# Patient Record
Sex: Female | Born: 1985 | Race: Black or African American | Hispanic: No | Marital: Single | Smoking: Never smoker
Health system: Southern US, Community
[De-identification: ages and names within clinical notes are randomized; demographics above are authoritative.]

## PROBLEM LIST (undated history)

## (undated) DIAGNOSIS — J302 Other seasonal allergic rhinitis: Secondary | ICD-10-CM

## (undated) DIAGNOSIS — Z8619 Personal history of other infectious and parasitic diseases: Secondary | ICD-10-CM

## (undated) DIAGNOSIS — G43909 Migraine, unspecified, not intractable, without status migrainosus: Secondary | ICD-10-CM

## (undated) DIAGNOSIS — K589 Irritable bowel syndrome without diarrhea: Secondary | ICD-10-CM

## (undated) DIAGNOSIS — B379 Candidiasis, unspecified: Secondary | ICD-10-CM

## (undated) DIAGNOSIS — Z8742 Personal history of other diseases of the female genital tract: Secondary | ICD-10-CM

## (undated) DIAGNOSIS — R490 Dysphonia: Secondary | ICD-10-CM

## (undated) DIAGNOSIS — Z8719 Personal history of other diseases of the digestive system: Secondary | ICD-10-CM

## (undated) DIAGNOSIS — T7840XA Allergy, unspecified, initial encounter: Secondary | ICD-10-CM

## (undated) DIAGNOSIS — J45909 Unspecified asthma, uncomplicated: Secondary | ICD-10-CM

## (undated) DIAGNOSIS — E282 Polycystic ovarian syndrome: Secondary | ICD-10-CM

## (undated) DIAGNOSIS — K629 Disease of anus and rectum, unspecified: Secondary | ICD-10-CM

## (undated) DIAGNOSIS — Z87410 Personal history of cervical dysplasia: Secondary | ICD-10-CM

## (undated) DIAGNOSIS — Z973 Presence of spectacles and contact lenses: Secondary | ICD-10-CM

## (undated) DIAGNOSIS — N926 Irregular menstruation, unspecified: Secondary | ICD-10-CM

## (undated) HISTORY — DX: Polycystic ovarian syndrome: E28.2

## (undated) HISTORY — DX: Personal history of other diseases of the female genital tract: Z87.42

## (undated) HISTORY — DX: Personal history of other infectious and parasitic diseases: Z86.19

## (undated) HISTORY — DX: Candidiasis, unspecified: B37.9

## (undated) HISTORY — PX: COLONOSCOPY: SHX174

## (undated) HISTORY — PX: TONGUE SURGERY: SHX810

## (undated) HISTORY — PX: LEEP: SHX91

## (undated) HISTORY — DX: Allergy, unspecified, initial encounter: T78.40XA

## (undated) HISTORY — PX: ROOT CANAL: SHX2363

## (undated) HISTORY — DX: Irritable bowel syndrome, unspecified: K58.9

## (undated) HISTORY — DX: Irregular menstruation, unspecified: N92.6

## (undated) HISTORY — PX: COLONOSCOPY W/ BIOPSIES: SHX1374

## (undated) HISTORY — DX: Migraine, unspecified, not intractable, without status migrainosus: G43.909

---

## 2004-12-17 ENCOUNTER — Emergency Department (HOSPITAL_COMMUNITY): Admission: EM | Admit: 2004-12-17 | Discharge: 2004-12-17 | Payer: Self-pay | Admitting: Emergency Medicine

## 2004-12-17 ENCOUNTER — Ambulatory Visit (HOSPITAL_COMMUNITY): Admission: RE | Admit: 2004-12-17 | Discharge: 2004-12-17 | Payer: Self-pay | Admitting: Emergency Medicine

## 2007-11-10 DIAGNOSIS — E282 Polycystic ovarian syndrome: Secondary | ICD-10-CM

## 2007-11-10 DIAGNOSIS — Z8742 Personal history of other diseases of the female genital tract: Secondary | ICD-10-CM

## 2007-11-10 DIAGNOSIS — N926 Irregular menstruation, unspecified: Secondary | ICD-10-CM

## 2007-11-10 HISTORY — DX: Irregular menstruation, unspecified: N92.6

## 2007-11-10 HISTORY — DX: Personal history of other diseases of the female genital tract: Z87.42

## 2007-11-10 HISTORY — DX: Polycystic ovarian syndrome: E28.2

## 2008-11-22 ENCOUNTER — Emergency Department (HOSPITAL_COMMUNITY): Admission: EM | Admit: 2008-11-22 | Discharge: 2008-11-22 | Payer: Self-pay | Admitting: Emergency Medicine

## 2010-09-11 ENCOUNTER — Emergency Department (HOSPITAL_COMMUNITY)
Admission: EM | Admit: 2010-09-11 | Discharge: 2010-09-11 | Payer: Self-pay | Source: Home / Self Care | Admitting: Family Medicine

## 2010-11-09 HISTORY — PX: BREAST SURGERY: SHX581

## 2011-04-08 ENCOUNTER — Encounter (HOSPITAL_BASED_OUTPATIENT_CLINIC_OR_DEPARTMENT_OTHER)
Admission: RE | Admit: 2011-04-08 | Discharge: 2011-04-08 | Disposition: A | Discharge status: Home / Self Care | Payer: BC Managed Care – PPO | Source: Ambulatory Visit | Attending: Specialist | Admitting: Specialist

## 2011-04-08 LAB — BASIC METABOLIC PANEL
CO2: 28 mEq/L (ref 19–32)
Chloride: 101 mEq/L (ref 96–112)
Creatinine, Ser: 0.66 mg/dL (ref 0.4–1.2)
GFR calc Af Amer: >60 mL/min (ref >60)
Sodium: 137 mEq/L (ref 135–145)

## 2011-04-08 LAB — CBC
Hemoglobin: 12.9 g/dL (ref 12.0–15.0)
MCH: 27.1 pg (ref 26.0–34.0)
Platelets: 312 10*3/uL (ref 150–400)
RBC: 4.76 MIL/uL (ref 3.87–5.11)
WBC: 7 10*3/uL (ref 4.0–10.5)

## 2011-04-08 LAB — DIFFERENTIAL
Basophils Absolute: 0 10*3/uL (ref 0.0–0.1)
Basophils Relative: 0 % (ref 0–1)
Eosinophils Absolute: 0.1 10*3/uL (ref 0.0–0.7)
Monocytes Relative: 7 % (ref 3–12)
Neutro Abs: 4.3 10*3/uL (ref 1.7–7.7)
Neutrophils Relative %: 61 % (ref 43–77)

## 2011-04-13 ENCOUNTER — Other Ambulatory Visit: Payer: Self-pay | Admitting: Specialist

## 2011-04-13 ENCOUNTER — Ambulatory Visit (HOSPITAL_BASED_OUTPATIENT_CLINIC_OR_DEPARTMENT_OTHER)
Admission: RE | Admit: 2011-04-13 | Discharge: 2011-04-14 | Disposition: A | Discharge status: Home / Self Care | Payer: BC Managed Care – PPO | Source: Ambulatory Visit | Attending: Specialist | Admitting: Specialist

## 2011-04-13 DIAGNOSIS — N62 Hypertrophy of breast: Secondary | ICD-10-CM | POA: Insufficient documentation

## 2011-04-13 DIAGNOSIS — Z01812 Encounter for preprocedural laboratory examination: Secondary | ICD-10-CM | POA: Insufficient documentation

## 2011-04-13 HISTORY — PX: BREAST REDUCTION SURGERY: SHX8

## 2011-04-13 LAB — POCT HEMOGLOBIN-HEMACUE: Hemoglobin: 12.3 g/dL (ref 12.0–15.0)

## 2011-09-14 NOTE — Op Note (Signed)
  Marie Singh, Marie Singh              ACCOUNT NO.:  [id number]  MEDICAL RECORD NO.:  [id number]  LOCATION:                                 FACILITY:  PHYSICIAN:  Earvin Hansen L. Shon Hough, M.D.   DATE OF BIRTH:  DATE OF PROCEDURE:  04/13/2011 DATE OF DISCHARGE:                              OPERATIVE REPORT   A 24-year lady with severe-severe macromastia, back and shoulder pain secondary to large pendulous breasts, intertriginous changes, resistant to conservative aggressive treatment.  PROCEDURES PLANNED:  Planned bilateral breast reductions using the inferior pedicle technique.  ANESTHESIA:  General.  DESCRIPTION OF THE PROCEDURE:  Preoperatively, the patient set up and drawn for the reduction mammoplasty, remarked nipple-areolar complexes back up to 21 cm from the suprasternal notch.  She then underwent general anesthesia and intubated orally.  Prep was done to the chest and breast areas in routine fashion.  Using Hibiclens soap and solution, walled off with sterile towels and drapes so as to make a sterile field. Xylocaine 0.25% with epinephrine injected locally, 200 mL per side 1:400,000 concentration.  This was allowed to sit.  The wounds were scored with #15 blade and the skin of the inferior pedicle was de- epithelialized with #20 blade.  Medial and lateral fatty dermal pedicles were incised down to underlying fascia.  Laterally more accessory breast tissue was removed.  The new keyhole area was also debulked and the flap trimmed appropriately.  After proper hemostasis, irrigation, and coagulation of bleeders, the flaps were transposed and stayed with 3-0 Prolene suture.  Subcutaneous closure was done with 3-0 Monocryl x2 layers and then running subcuticular stitch of 3-0 Monocryl and 5-0 Monocryl throughout the inverted T.  The wounds were cleansed.  Steri- Strips and soft dressing were applied to all areas.  They were also drained with #10 fully fluted Blake drains, which  were placed in the depths of the wound and brought out laterally and secured with 3-0 Prolene.  After sterile dressing were placed, the patient was taken to the recovery room in excellent condition.  Nipple-areolar complexes were examined showing excellent symmetry and blood supply.     Yaakov Guthrie. Shon Hough, M.D.     Cathie Hoops  D:  04/13/2011  T:  04/14/2011  Job:  [id number]  Electronically Signed by Louisa Second M.D. on 09/14/2011 07:08:08 PM

## 2012-06-13 ENCOUNTER — Telehealth: Payer: Self-pay | Admitting: Obstetrics and Gynecology

## 2012-06-13 NOTE — Telephone Encounter (Signed)
TC to pt. States had been having recurrent vaginal itching and irritation on and off x 1 month. Used Monistat but has recurred.  Sched for eval with Dr AVS 06/21/12 per pt preference.

## 2012-06-13 NOTE — Telephone Encounter (Signed)
TRIAGE/GEN.QUEST. °

## 2012-06-21 ENCOUNTER — Encounter: Payer: Self-pay | Admitting: Obstetrics and Gynecology

## 2012-06-21 ENCOUNTER — Ambulatory Visit (INDEPENDENT_AMBULATORY_CARE_PROVIDER_SITE_OTHER): Payer: BC Managed Care – PPO | Admitting: Obstetrics and Gynecology

## 2012-06-21 VITALS — BP 110/78 | Resp 16 | Ht 65.0 in | Wt 188.0 lb

## 2012-06-21 DIAGNOSIS — N899 Noninflammatory disorder of vagina, unspecified: Secondary | ICD-10-CM

## 2012-06-21 DIAGNOSIS — N898 Other specified noninflammatory disorders of vagina: Secondary | ICD-10-CM

## 2012-06-21 LAB — POCT WET PREP (WET MOUNT)

## 2012-06-21 MED ORDER — NYSTATIN-TRIAMCINOLONE 100000-0.1 UNIT/GM-% EX CREA: TOPICAL_CREAM | Freq: Four times a day (QID) | CUTANEOUS | Status: DC

## 2012-06-21 MED ORDER — FLUCONAZOLE 150 MG PO TABS: 150.0000 mg | ORAL_TABLET | Freq: Every day | ORAL | Status: AC

## 2012-06-21 NOTE — Patient Instructions (Signed)
Candida Infection, Adult A candida infection (also called yeast, fungus and Monilia infection) is an overgrowth of yeast that can occur anywhere on the body. A yeast infection commonly occurs in warm, moist body areas. Usually, the infection remains localized but can spread to become a systemic infection. A yeast infection may be a sign of a more severe disease such as diabetes, leukemia, or AIDS. A yeast infection can occur in both men and women. In women, Candida vaginitis is a vaginal infection. It is one of the most common causes of vaginitis. Men usually do not have symptoms or know they have an infection until other problems develop. Men may find out they have a yeast infection because their sex partner has a yeast infection. Uncircumcised men are more likely to get a yeast infection than circumcised men. This is because the uncircumcised glans is not exposed to air and does not remain as dry as that of a circumcised glans. Older adults may develop yeast infections around dentures. CAUSES  Women  Antibiotics.   Steroid medication taken for a long time.   Being overweight (obese).   Diabetes.   Poor immune condition.   Certain serious medical conditions.   Immune suppressive medications for organ transplant patients.   Chemotherapy.   Pregnancy.   Menstration.   Stress and fatigue.   Intravenous drug use.   Oral contraceptives.   Wearing tight-fitting clothes in the crotch area.   Catching it from a sex partner who has a yeast infection.   Spermicide.   Intravenous, urinary, or other catheters.  Men  Catching it from a sex partner who has a yeast infection.   Having oral or anal sex with a person who has the infection.   Spermicide.   Diabetes.   Antibiotics.   Poor immune system.   Medications that suppress the immune system.   Intravenous drug use.   Intravenous, urinary, or other catheters.  SYMPTOMS  Women  Thick, white vaginal discharge.    Vaginal itching.   Redness and swelling in and around the vagina.   Irritation of the lips of the vagina and perineum.   Blisters on the vaginal lips and perineum.   Painful sexual intercourse.   Low blood sugar (hypoglycemia).   Painful urination.   Bladder infections.   Intestinal problems such as constipation, indigestion, bad breath, bloating, increase in gas, diarrhea, or loose stools.  Men  Men may develop intestinal problems such as constipation, indigestion, bad breath, bloating, increase in gas, diarrhea, or loose stools.   Dry, cracked skin on the penis with itching or discomfort.   Jock itch.   Dry, flaky skin.   Athlete's foot.   Hypoglycemia.  DIAGNOSIS  Women  A history and an exam are performed.   The discharge may be examined under a microscope.   A culture may be taken of the discharge.  Men  A history and an exam are performed.   Any discharge from the penis or areas of cracked skin will be looked at under the microscope and cultured.   Stool samples may be cultured.  TREATMENT  Women  Vaginal antifungal suppositories and creams.   Medicated creams to decrease irritation and itching on the outside of the vagina.   Warm compresses to the perineal area to decrease swelling and discomfort.   Oral antifungal medications.   Medicated vaginal suppositories or cream for repeated or recurrent infections.   Wash and dry the irritation areas before applying the cream.     Eating yogurt with lactobacillus may help with prevention and treatment.   Sometimes painting the vagina with gentian violet solution may help if creams and suppositories do not work.  Men  Antifungal creams and oral antifungal medications.   Sometimes treatment must continue for 30 days after the symptoms go away to prevent recurrence.  HOME CARE INSTRUCTIONS  Women  Use cotton underwear and avoid tight-fitting clothing.   Avoid colored, scented toilet paper and  deodorant tampons or pads.   Do not douche.   Keep your diabetes under control.   Finish all the prescribed medications.   Keep your skin clean and dry.   Consume milk or yogurt with lactobacillus active culture regularly. If you get frequent yeast infections and think that is what the infection is, there are over-the-counter medications that you can get. If the infection does not show healing in 3 days, talk to your caregiver.   Tell your sex partner you have a yeast infection. Your partner may need treatment also, especially if your infection does not clear up or recurs.  Men  Keep your skin clean and dry.   Keep your diabetes under control.   Finish all prescribed medications.   Tell your sex partner that you have a yeast infection so they can be treated if necessary.  SEEK MEDICAL CARE IF:   Your symptoms do not clear up or worsen in one week after treatment.   You have an oral temperature above 102 F (38.9 C).   You have trouble swallowing or eating for a prolonged time.   You develop blisters on and around your vagina.   You develop vaginal bleeding and it is not your menstrual period.   You develop abdominal pain.   You develop intestinal problems as mentioned above.   You get weak or lightheaded.   You have painful or increased urination.   You have pain during sexual intercourse.  MAKE SURE YOU:   Understand these instructions.   Will watch your condition.   Will get help right away if you are not doing well or get worse.  Document Released: 12/03/2004 Document Revised: 10/15/2011 Document Reviewed: 03/17/2010 [employer] Patient Information 2012 [employer]. 

## 2012-06-21 NOTE — Progress Notes (Signed)
Odor: no Fever: no Pelvic Pain: no  Itching: yes Dyspareunia: no Desires GC/CT: no  Thin: no History of PID: no Desires HIV,RPR,HbsAG: no  Thick: yes History of STD: no Other: pt states she didi use "One a day" to try to help before w/ yeast infection.    HISTORY OF PRESENT ILLNESS  [name] is a 26 y.o. year old female,No obstetric history on file., who presents for a problem visit. The patient complains of vulvar and vaginal irritation.  She has used over-the-counter yeast medication.  Discomfort and continues to return.  The patient is not sexually active.  She does take oral contraceptives for dysmenorrhea.  Subjective:  See the written copy of this report in the patient's paper medical record.  These results did not interface directly into the electronic medical record and are summarized here. History of present illness.  Objective:  BP 110/78  Resp 16  Ht 5\' 5"  (1.651 m)  Wt 188 lb (85.276 kg)  BMI 31.28 kg/m2  LMP 06/13/2012   General: no distress GI: soft and nontender  External genitalia: dry scaly skin Vaginal: normal without tenderness, induration or masses Cervix: normal appearance Adnexa: normal bimanual exam Uterus: normal size shape and consistency  Wet prep: PH 4.5.  No clue cells.  No yeast seen. Whiff negative . Assessment:  Vaginal and vulvar irritation.  Plan:  We'll try Diflucan 150 mg and Mycolog-II cream as needed.  Call if no improvement.  Annual exam in November 2013.  Return to office prn if symptoms worsen or fail to improve.   Leonard Schwartz M.D.  06/21/2012 3:20 PM

## 2012-10-05 ENCOUNTER — Encounter: Payer: Self-pay | Admitting: Obstetrics and Gynecology

## 2012-10-05 ENCOUNTER — Ambulatory Visit (INDEPENDENT_AMBULATORY_CARE_PROVIDER_SITE_OTHER): Payer: BC Managed Care – PPO | Admitting: Obstetrics and Gynecology

## 2012-10-05 VITALS — BP 94/62 | Resp 16 | Ht 64.5 in | Wt 183.0 lb

## 2012-10-05 DIAGNOSIS — Z01419 Encounter for gynecological examination (general) (routine) without abnormal findings: Secondary | ICD-10-CM

## 2012-10-05 MED ORDER — NORETHINDRONE-ETH ESTRADIOL 1-35 MG-MCG PO TABS: 1.0000 | ORAL_TABLET | Freq: Every day | ORAL | Status: DC

## 2012-10-05 NOTE — Progress Notes (Signed)
ANNUAL GYNECOLOGIC EXAMINATION   Marie Singh is a 26 y.o. female, No obstetric history on file., who presents for an annual exam. The patient has a history of polycystic ovary syndrome and dysmenorrhea.  She was started on Necon 1/35 birth control pills.  She is doing very well at this point.  She is not sexually active.    History   Social History  . Marital Status: Single    Spouse Name: N/A    Number of Children: N/A  . Years of Education: N/A   Social History Main Topics  . Smoking status: Never Smoker   . Smokeless tobacco: Never Used  . Alcohol Use: Yes  . Drug Use: None  . Sexually Active: Not Currently    Birth Control/ Protection: Pill   Other Topics Concern  . None   Social History Narrative  . None    Menstrual cycle:   LMP: Patient's last menstrual period was 09/23/2012.             The following portions of the patient's history were reviewed and updated as appropriate: allergies, current medications, past family history, past medical history, past social history, past surgical history and problem list.  Review of Systems Pertinent items are noted in HPI. Breast:Negative for breast lump,nipple discharge or nipple retraction Gastrointestinal: Negative for abdominal pain, change in bowel habits or rectal bleeding Urinary:negative   Objective:    Ht 5' 4.5" (1.638 m)  Wt 183 lb (83.008 kg)  BMI 30.93 kg/m2  LMP 09/23/2012    Weight:  Wt Readings from Last 1 Encounters:  10/05/12 183 lb (83.008 kg)          BMI: Body mass index is 30.93 kg/(m^2).  General Appearance: Alert, appropriate appearance for age. No acute distress HEENT: Grossly normal Neck / Thyroid: Supple, no masses, nodes or enlargement Lungs: clear to auscultation bilaterally Back: No CVA tenderness Breast Exam: No masses or nodes.No dimpling, nipple retraction or discharge. Cardiovascular: Regular rate and rhythm. S1, S2, no murmur Gastrointestinal: Soft, non-tender, no masses or  organomegaly  ++++++++++++++++++++++++++++++++++++++++++++++++++++++++  Pelvic Exam: External genitalia: normal general appearance Vaginal: normal without tenderness, induration or masses. Relaxation: No Cervix: normal appearance Adnexa: normal bimanual exam Uterus: normal size, shape, and consistency Rectovaginal: normal rectal, no masses  ++++++++++++++++++++++++++++++++++++++++++++++++++++++++  Lymphatic Exam: Non-palpable nodes in neck, clavicular, axillary, or inguinal regions Neurologic: Normal speech, no tremor  Psychiatric: Alert and oriented, appropriate affect.  Assessment:    Normal gyn exam   Overweight or obese: Yes   Pelvic relaxation: No  Polycystic ovary syndrome-improved.  Dysmenorrhea-improved.   Plan:    return annually or prn Contraception:Necon 1/35    Medications prescribed: none  STD screen request: No   The updated Pap smear screening guidelines were discussed with the patient. The patient requested that I obtain a Pap smear: No.  Kegel exercises discussed: No.  Proper diet and regular exercise were reviewed.  Annual mammograms recommended starting at age 62. Proper breast care was discussed.  Screening colonoscopy is recommended beginning at age 26.  Regular health maintenance was reviewed.  Sleep hygiene was discussed.  Leonard Schwartz M.D.    Regular Periods: yes Mammogram: no  Monthly Breast Ex.: yes Exercise: yes  Tetanus < 10 years: yes Seatbelts: yes  NI. Bladder Functn.: yes Abuse at home: no  Daily BM's: yes Stressful Work: yes  Healthy Diet: yes Sigmoid-Colonoscopy: Never  Calcium: no Medical problems this year: None per pt    LAST PAP:09/21/2011  Contraception: Necon 1/35  Mammogram:  Never  PCP: Dr.Lucas  PMH: No Changes  FMH: No Changes  Last Bone Scan: Never

## 2013-02-03 ENCOUNTER — Encounter: Payer: Self-pay | Admitting: Obstetrics and Gynecology

## 2013-02-03 ENCOUNTER — Other Ambulatory Visit: Payer: Self-pay | Admitting: Obstetrics and Gynecology

## 2013-02-06 LAB — PAP IG W/ RFLX HPV ASCU

## 2013-02-27 ENCOUNTER — Other Ambulatory Visit: Payer: Self-pay | Admitting: Obstetrics and Gynecology

## 2013-03-25 ENCOUNTER — Encounter (HOSPITAL_COMMUNITY): Payer: Self-pay | Admitting: *Deleted

## 2013-03-25 DIAGNOSIS — Z880 Allergy status to penicillin: Secondary | ICD-10-CM | POA: Insufficient documentation

## 2013-03-25 DIAGNOSIS — Z8742 Personal history of other diseases of the female genital tract: Secondary | ICD-10-CM | POA: Insufficient documentation

## 2013-03-25 DIAGNOSIS — Z3202 Encounter for pregnancy test, result negative: Secondary | ICD-10-CM | POA: Insufficient documentation

## 2013-03-25 DIAGNOSIS — IMO0002 Reserved for concepts with insufficient information to code with codable children: Secondary | ICD-10-CM | POA: Insufficient documentation

## 2013-03-25 DIAGNOSIS — S0993XA Unspecified injury of face, initial encounter: Secondary | ICD-10-CM | POA: Diagnosis present

## 2013-03-25 DIAGNOSIS — Z8639 Personal history of other endocrine, nutritional and metabolic disease: Secondary | ICD-10-CM | POA: Insufficient documentation

## 2013-03-25 DIAGNOSIS — Z862 Personal history of diseases of the blood and blood-forming organs and certain disorders involving the immune mechanism: Secondary | ICD-10-CM | POA: Diagnosis not present

## 2013-03-25 DIAGNOSIS — Y92410 Unspecified street and highway as the place of occurrence of the external cause: Secondary | ICD-10-CM | POA: Insufficient documentation

## 2013-03-25 DIAGNOSIS — Y9389 Activity, other specified: Secondary | ICD-10-CM | POA: Insufficient documentation

## 2013-03-25 DIAGNOSIS — Z8619 Personal history of other infectious and parasitic diseases: Secondary | ICD-10-CM | POA: Insufficient documentation

## 2013-03-25 DIAGNOSIS — Z8679 Personal history of other diseases of the circulatory system: Secondary | ICD-10-CM | POA: Insufficient documentation

## 2013-03-25 DIAGNOSIS — Z79899 Other long term (current) drug therapy: Secondary | ICD-10-CM | POA: Insufficient documentation

## 2013-03-25 DIAGNOSIS — Z8719 Personal history of other diseases of the digestive system: Secondary | ICD-10-CM | POA: Insufficient documentation
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
## 2013-03-26 ENCOUNTER — Emergency Department (HOSPITAL_COMMUNITY)
Admission: EM | Admit: 2013-03-26 | Discharge: 2013-03-26 | Disposition: A | Discharge status: Home / Self Care | Payer: BC Managed Care – PPO | Attending: Emergency Medicine | Admitting: Emergency Medicine

## 2013-03-26 ENCOUNTER — Emergency Department (HOSPITAL_COMMUNITY): Payer: BC Managed Care – PPO

## 2013-03-26 DIAGNOSIS — S0993XA Unspecified injury of face, initial encounter: Secondary | ICD-10-CM | POA: Diagnosis not present

## 2013-03-26 DIAGNOSIS — S199XXA Unspecified injury of neck, initial encounter: Secondary | ICD-10-CM | POA: Diagnosis not present

## 2013-03-26 MED ORDER — TRAMADOL HCL 50 MG PO TABS: 50.0000 mg | ORAL_TABLET | Freq: Four times a day (QID) | ORAL | Status: DC | PRN

## 2013-03-26 MED ORDER — IBUPROFEN 800 MG PO TABS: 800.0000 mg | ORAL_TABLET | Freq: Three times a day (TID) | ORAL | Status: DC

## 2013-03-26 MED ORDER — CYCLOBENZAPRINE HCL 10 MG PO TABS: 10.0000 mg | ORAL_TABLET | Freq: Two times a day (BID) | ORAL | Status: DC | PRN

## 2013-03-26 MED ORDER — IBUPROFEN 800 MG PO TABS
800.0000 mg | ORAL_TABLET | Freq: Once | ORAL | Status: AC
  Administered 2013-03-26: 800 mg via ORAL
  Filled 2013-03-26: qty 1

## 2013-03-26 MED ORDER — DIAZEPAM 5 MG PO TABS
5.0000 mg | ORAL_TABLET | Freq: Once | ORAL | Status: AC
  Administered 2013-03-26: 5 mg via ORAL
  Filled 2013-03-26: qty 1

## 2013-03-26 NOTE — ED Notes (Signed)
Pt returned from radiology.

## 2013-03-26 NOTE — ED Notes (Signed)
MD at bedside for assessment

## 2013-03-26 NOTE — ED Notes (Signed)
Pt denies numbness or tingling to extremities.

## 2013-03-26 NOTE — ED Notes (Signed)
Pt's family states understanding of discharge instructions
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
## 2013-07-24 ENCOUNTER — Encounter (HOSPITAL_COMMUNITY): Payer: Self-pay | Admitting: Emergency Medicine

## 2013-07-24 ENCOUNTER — Emergency Department (HOSPITAL_COMMUNITY)
Admission: EM | Admit: 2013-07-24 | Discharge: 2013-07-24 | Disposition: A | Discharge status: Home / Self Care | Payer: BC Managed Care – PPO | Source: Home / Self Care | Attending: Family Medicine | Admitting: Family Medicine

## 2013-07-24 DIAGNOSIS — R51 Headache: Secondary | ICD-10-CM

## 2013-07-24 MED ORDER — KETOROLAC TROMETHAMINE 30 MG/ML IJ SOLN
INTRAMUSCULAR | Status: AC
  Filled 2013-07-24: qty 1

## 2013-07-24 MED ORDER — FLUTICASONE PROPIONATE 50 MCG/ACT NA SUSP: 2.0000 | Freq: Two times a day (BID) | NASAL | Status: DC

## 2013-07-24 MED ORDER — HYDROCOD POLST-CHLORPHEN POLST 10-8 MG/5ML PO LQCR: 5.0000 mL | Freq: Two times a day (BID) | ORAL | Status: DC | PRN

## 2013-07-24 MED ORDER — KETOROLAC TROMETHAMINE 30 MG/ML IJ SOLN
30.0000 mg | Freq: Once | INTRAMUSCULAR | Status: AC
  Administered 2013-07-24: 30 mg via INTRAMUSCULAR

## 2013-07-24 NOTE — ED Notes (Signed)
C/o headache which started last night.  Patient states she has pressure behind her eye due to headache. PCP not aware.

## 2013-07-24 NOTE — ED Provider Notes (Signed)
CSN: [id number]     Arrival date & time 07/24/13  1722 History   First MD Initiated Contact with Patient 07/24/13 1809     Chief Complaint  Patient presents with  . Headache   (Consider location/radiation/quality/duration/timing/severity/associated sxs/prior Treatment) Patient is a 27 y.o. female presenting with headaches. The history is provided by the patient and a parent.  Headache Pain location:  R temporal Quality:  Stabbing Radiates to:  Does not radiate Onset quality:  Gradual Duration:  1 day Timing:  Constant Progression:  Unchanged Chronicity:  New Similar to prior headaches: yes   Context: coughing   Associated symptoms: congestion, cough and eye pain   Associated symptoms: no blurred vision, no facial pain, no fever, no nausea and no visual change     Past Medical History  Diagnosis Date  . Migraine     Frequent  . IBS (irritable bowel syndrome)   . H/O varicella   . Yeast infection   . H/O dysmenorrhea 2009  . Irregular periods/menstrual cycles 2009  . PCOS (polycystic ovarian syndrome) 2009   Past Surgical History  Procedure Laterality Date  . Tongue surgery    . Root canal    . Breast surgery  2012    Breast  Reduction   Family History  Problem Relation Age of Onset  . Diabetes Mother   . Heart attack Father   . Hypertension Father    History  Substance Use Topics  . Smoking status: Never Smoker   . Smokeless tobacco: Never Used  . Alcohol Use: Yes   OB History   Grav Para Term Preterm Abortions TAB SAB Ect Mult Living                 Review of Systems  Constitutional: Negative.  Negative for fever.  HENT: Positive for congestion.   Eyes: Positive for pain. Negative for blurred vision.  Respiratory: Positive for cough.   Gastrointestinal: Negative for nausea.  Neurological: Positive for headaches.    Allergies  Amoxicillin  Home Medications   Current Outpatient Rx  Name  Route  Sig  Dispense  Refill  . ibuprofen (ADVIL,MOTRIN)  800 MG tablet   Oral   Take 1 tablet (800 mg total) by mouth 3 (three) times daily.   21 tablet   0   . norethindrone-ethinyl estradiol 1/35 (NECON 1/35, 28,) tablet   Oral   Take 1 tablet by mouth daily.   1 Package   11   . chlorpheniramine-HYDROcodone (TUSSIONEX PENNKINETIC ER) 10-8 MG/5ML LQCR   Oral   Take 5 mLs by mouth every 12 (twelve) hours as needed.   115 mL   0   . cyclobenzaprine (FLEXERIL) 10 MG tablet   Oral   Take 1 tablet (10 mg total) by mouth 2 (two) times daily as needed for muscle spasms.   20 tablet   0   . fluticasone (FLONASE) 50 MCG/ACT nasal spray   Nasal   Place 2 sprays into the nose 2 (two) times daily.   1 g   2   . traMADol (ULTRAM) 50 MG tablet   Oral   Take 1 tablet (50 mg total) by mouth every 6 (six) hours as needed for pain.   15 tablet   0    BP 114/72  Pulse 70  Temp(Src) 97.7 F (36.5 C) (Oral)  Resp 18  SpO2 98%  LMP 07/16/2013 Physical Exam  Nursing note and vitals reviewed. Constitutional: She is oriented to person,  place, and time. She appears well-developed and well-nourished. No distress.  HENT:  Head: Normocephalic.  Right Ear: External ear normal.  Left Ear: External ear normal.  Nose: Mucosal edema and rhinorrhea present.  Mouth/Throat: Oropharynx is clear and moist.  Eyes: Conjunctivae and EOM are normal. Pupils are equal, round, and reactive to light.  Neck: Normal range of motion. Neck supple.  Cardiovascular: Normal rate, normal heart sounds and intact distal pulses.   Pulmonary/Chest: Effort normal and breath sounds normal.  Abdominal: Bowel sounds are normal.  Lymphadenopathy:    She has no cervical adenopathy.  Neurological: She is alert and oriented to person, place, and time. No cranial nerve deficit. She exhibits normal muscle tone. Coordination normal.  Skin: Skin is warm and dry.    ED Course  Procedures (including critical care time) Labs Review Labs Reviewed - No data to display Imaging  Review No results found.  MDM      Linna Hoff, MD 07/24/13 [phone]

## 2013-09-08 ENCOUNTER — Emergency Department (HOSPITAL_COMMUNITY)
Admission: EM | Admit: 2013-09-08 | Discharge: 2013-09-08 | Disposition: A | Discharge status: Home / Self Care | Payer: BC Managed Care – PPO | Source: Home / Self Care

## 2013-09-08 ENCOUNTER — Encounter (HOSPITAL_COMMUNITY): Payer: Self-pay | Admitting: Emergency Medicine

## 2013-09-08 DIAGNOSIS — N390 Urinary tract infection, site not specified: Secondary | ICD-10-CM

## 2013-09-08 LAB — POCT URINALYSIS DIP (DEVICE)
Glucose, UA: NEGATIVE mg/dL
Ketones, ur: NEGATIVE mg/dL
Protein, ur: NEGATIVE mg/dL
Specific Gravity, Urine: 1.015 (ref 1.005–1.030)
Urobilinogen, UA: 0.2 mg/dL (ref 0.0–1.0)
pH: 8.5 — ABNORMAL HIGH (ref 5.0–8.0)

## 2013-09-08 LAB — POCT PREGNANCY, URINE: Preg Test, Ur: NEGATIVE

## 2013-09-08 MED ORDER — CIPROFLOXACIN HCL 500 MG PO TABS: 500.0000 mg | ORAL_TABLET | Freq: Two times a day (BID) | ORAL | Status: DC

## 2013-09-08 NOTE — ED Notes (Signed)
States she did a home test that showed she probably has a UTI, having symptoms

## 2013-09-08 NOTE — ED Provider Notes (Signed)
CSN: [id number]     Arrival date & time 09/08/13  1651 History   None    Chief Complaint  Patient presents with  . Urinary Tract Infection   (Consider location/radiation/quality/duration/timing/severity/associated sxs/prior Treatment) Patient is a 27 y.o. female presenting with urinary tract infection. The history is provided by the patient.  Urinary Tract Infection This is a new problem. The current episode started more than 1 week ago. The problem has been gradually worsening. Associated symptoms include abdominal pain.    Past Medical History  Diagnosis Date  . Migraine     Frequent  . IBS (irritable bowel syndrome)   . H/O varicella   . Yeast infection   . H/O dysmenorrhea 2009  . Irregular periods/menstrual cycles 2009  . PCOS (polycystic ovarian syndrome) 2009   Past Surgical History  Procedure Laterality Date  . Tongue surgery    . Root canal    . Breast surgery  2012    Breast  Reduction   Family History  Problem Relation Age of Onset  . Diabetes Mother   . Heart attack Father   . Hypertension Father    History  Substance Use Topics  . Smoking status: Never Smoker   . Smokeless tobacco: Never Used  . Alcohol Use: Yes   OB History   Grav Para Term Preterm Abortions TAB SAB Ect Mult Living                 Review of Systems  Constitutional: Negative for fever and chills.  Gastrointestinal: Positive for abdominal pain. Negative for nausea and vomiting.  Genitourinary: Positive for dysuria, urgency and frequency. Negative for vaginal discharge.    Allergies  Amoxicillin  Home Medications   Current Outpatient Rx  Name  Route  Sig  Dispense  Refill  . norethindrone-ethinyl estradiol 1/35 ([place] 1/35, 28,) tablet   Oral   Take 1 tablet by mouth daily.   1 Package   11   . chlorpheniramine-HYDROcodone (TUSSIONEX PENNKINETIC ER) 10-8 MG/5ML LQCR   Oral   Take 5 mLs by mouth every 12 (twelve) hours as needed.   115 mL   0   . ciprofloxacin  (CIPRO) 500 MG tablet   Oral   Take 1 tablet (500 mg total) by mouth 2 (two) times daily.   14 tablet   0   . cyclobenzaprine (FLEXERIL) 10 MG tablet   Oral   Take 1 tablet (10 mg total) by mouth 2 (two) times daily as needed for muscle spasms.   20 tablet   0   . fluticasone (FLONASE) 50 MCG/ACT nasal spray   Nasal   Place 2 sprays into the nose 2 (two) times daily.   1 g   2   . ibuprofen (ADVIL,MOTRIN) 800 MG tablet   Oral   Take 1 tablet (800 mg total) by mouth 3 (three) times daily.   21 tablet   0   . traMADol (ULTRAM) 50 MG tablet   Oral   Take 1 tablet (50 mg total) by mouth every 6 (six) hours as needed for pain.   15 tablet   0    BP 113/73  Pulse 60  Temp(Src) 98.8 F (37.1 C) (Oral)  Resp 14  SpO2 100% Physical Exam  Nursing note and vitals reviewed. Constitutional: She is oriented to person, place, and time. She appears well-developed and well-nourished.  Abdominal: Soft. Bowel sounds are normal. She exhibits no distension and no mass. There is tenderness in the  suprapubic area. There is no rigidity, no rebound, no guarding and no CVA tenderness.  Neurological: She is alert and oriented to person, place, and time.  Skin: Skin is warm and dry.    ED Course  Procedures (including critical care time) Labs Review Labs Reviewed  POCT URINALYSIS DIP (DEVICE) - Abnormal; Notable for the following:    Hgb urine dipstick LARGE (*)    pH 8.5 (*)    Leukocytes, UA MODERATE (*)    All other components within normal limits  POCT PREGNANCY, URINE   Imaging Review No results found.    MDM      Linna Hoff, MD 09/08/13 1800

## 2013-11-01 ENCOUNTER — Other Ambulatory Visit: Payer: Self-pay | Admitting: Obstetrics and Gynecology

## 2014-09-19 ENCOUNTER — Ambulatory Visit (HOSPITAL_COMMUNITY): Payer: BC Managed Care – PPO | Attending: Emergency Medicine

## 2014-09-19 ENCOUNTER — Emergency Department (HOSPITAL_COMMUNITY)
Admission: EM | Admit: 2014-09-19 | Discharge: 2014-09-19 | Disposition: A | Discharge status: Home / Self Care | Payer: BC Managed Care – PPO | Source: Home / Self Care | Attending: Emergency Medicine | Admitting: Emergency Medicine

## 2014-09-19 ENCOUNTER — Encounter (HOSPITAL_COMMUNITY): Payer: Self-pay | Admitting: Emergency Medicine

## 2014-09-19 DIAGNOSIS — R0602 Shortness of breath: Secondary | ICD-10-CM | POA: Diagnosis present

## 2014-09-19 DIAGNOSIS — Z793 Long term (current) use of hormonal contraceptives: Secondary | ICD-10-CM | POA: Diagnosis not present

## 2014-09-19 DIAGNOSIS — J40 Bronchitis, not specified as acute or chronic: Secondary | ICD-10-CM | POA: Insufficient documentation

## 2014-09-19 DIAGNOSIS — K589 Irritable bowel syndrome without diarrhea: Secondary | ICD-10-CM | POA: Insufficient documentation

## 2014-09-19 DIAGNOSIS — J069 Acute upper respiratory infection, unspecified: Secondary | ICD-10-CM | POA: Diagnosis present

## 2014-09-19 DIAGNOSIS — G43909 Migraine, unspecified, not intractable, without status migrainosus: Secondary | ICD-10-CM | POA: Insufficient documentation

## 2014-09-19 DIAGNOSIS — E282 Polycystic ovarian syndrome: Secondary | ICD-10-CM | POA: Diagnosis not present

## 2014-09-19 MED ORDER — AEROCHAMBER PLUS FLO-VU LARGE MISC: 1.0000 | Freq: Once | Status: DC

## 2014-09-19 MED ORDER — GUAIFENESIN-CODEINE 100-10 MG/5ML PO SOLN: 5.0000 mL | ORAL | Status: DC | PRN

## 2014-09-19 MED ORDER — ALBUTEROL SULFATE HFA 108 (90 BASE) MCG/ACT IN AERS: 2.0000 | INHALATION_SPRAY | RESPIRATORY_TRACT | Status: DC | PRN

## 2014-09-19 NOTE — ED Notes (Signed)
C/o cold sx onset this am Sx include productive cough, PND Reports she was sick about a month ago w/cold sx Denies f/v/n/d, SOB, wheezing Alert, no signs of acute distress.

## 2014-09-19 NOTE — Discharge Instructions (Signed)
How to Use an Inhaler Proper inhaler technique is very important. Good technique ensures that the medicine reaches the lungs. Poor technique results in depositing the medicine on the tongue and back of the throat rather than in the airways. If you do not use the inhaler with good technique, the medicine will not help you. STEPS TO FOLLOW IF USING AN INHALER WITHOUT AN EXTENSION TUBE  Remove the cap from the inhaler.  If you are using the inhaler for the first time, you will need to prime it. Shake the inhaler for 5 seconds and release four puffs into the air, away from your face. Ask your health care provider or pharmacist if you have questions about priming your inhaler.  Shake the inhaler for 5 seconds before each breath in (inhalation).  Position the inhaler so that the top of the canister faces up.  Put your index finger on the top of the medicine canister. Your thumb supports the bottom of the inhaler.  Open your mouth.  Either place the inhaler between your teeth and place your lips tightly around the mouthpiece, or hold the inhaler 1-2 inches away from your open mouth. If you are unsure of which technique to use, ask your health care provider.  Breathe out (exhale) normally and as completely as possible.  Press the canister down with your index finger to release the medicine.  At the same time as the canister is pressed, inhale deeply and slowly until your lungs are completely filled. This should take 4-6 seconds. Keep your tongue down.  Hold the medicine in your lungs for 5-10 seconds (10 seconds is best). This helps the medicine get into the small airways of your lungs.  Breathe out slowly, through pursed lips. Whistling is an example of pursed lips.  Wait at least 15-30 seconds between puffs. Continue with the above steps until you have taken the number of puffs your health care provider has ordered. Do not use the inhaler more than your health care provider tells  you.  Replace the cap on the inhaler.  Follow the directions from your health care provider or the inhaler insert for cleaning the inhaler. STEPS TO FOLLOW IF USING AN INHALER WITH AN EXTENSION (SPACER)  Remove the cap from the inhaler.  If you are using the inhaler for the first time, you will need to prime it. Shake the inhaler for 5 seconds and release four puffs into the air, away from your face. Ask your health care provider or pharmacist if you have questions about priming your inhaler.  Shake the inhaler for 5 seconds before each breath in (inhalation).  Place the open end of the spacer onto the mouthpiece of the inhaler.  Position the inhaler so that the top of the canister faces up and the spacer mouthpiece faces you.  Put your index finger on the top of the medicine canister. Your thumb supports the bottom of the inhaler and the spacer.  Breathe out (exhale) normally and as completely as possible.  Immediately after exhaling, place the spacer between your teeth and into your mouth. Close your lips tightly around the spacer.  Press the canister down with your index finger to release the medicine.  At the same time as the canister is pressed, inhale deeply and slowly until your lungs are completely filled. This should take 4-6 seconds. Keep your tongue down and out of the way.  Hold the medicine in your lungs for 5-10 seconds (10 seconds is best). This helps the  medicine get into the small airways of your lungs. Exhale.  Repeat inhaling deeply through the spacer mouthpiece. Again hold that breath for up to 10 seconds (10 seconds is best). Exhale slowly. If it is difficult to take this second deep breath through the spacer, breathe normally several times through the spacer. Remove the spacer from your mouth.  Wait at least 15-30 seconds between puffs. Continue with the above steps until you have taken the number of puffs your health care provider has ordered. Do not use the  inhaler more than your health care provider tells you.  Remove the spacer from the inhaler, and place the cap on the inhaler.  Follow the directions from your health care provider or the inhaler insert for cleaning the inhaler and spacer. If you are using different kinds of inhalers, use your quick relief medicine to open the airways 10-15 minutes before using a steroid if instructed to do so by your health care provider. If you are unsure which inhalers to use and the order of using them, ask your health care provider, nurse, or respiratory therapist. If you are using a steroid inhaler, always rinse your mouth with water after your last puff, then gargle and spit out the water. Do not swallow the water. AVOID:  Inhaling before or after starting the spray of medicine. It takes practice to coordinate your breathing with triggering the spray.  Inhaling through the nose (rather than the mouth) when triggering the spray. HOW TO DETERMINE IF YOUR INHALER IS FULL OR NEARLY EMPTY You cannot know when an inhaler is empty by shaking it. A few inhalers are now being made with dose counters. Ask your health care provider for a prescription that has a dose counter if you feel you need that extra help. If your inhaler does not have a counter, ask your health care provider to help you determine the date you need to refill your inhaler. Write the refill date on a calendar or your inhaler canister. Refill your inhaler 7-10 days before it runs out. Be sure to keep an adequate supply of medicine. This includes making sure it is not expired, and that you have a spare inhaler.  SEEK MEDICAL CARE IF:   Your symptoms are only partially relieved with your inhaler.  You are having trouble using your inhaler.  You have some increase in phlegm. SEEK IMMEDIATE MEDICAL CARE IF:   You feel little or no relief with your inhalers. You are still wheezing and are feeling shortness of breath or tightness in your chest or  both.  You have dizziness, headaches, or a fast heart rate.  You have chills, fever, or night sweats.  You have a noticeable increase in phlegm production, or there is blood in the phlegm. MAKE SURE YOU:   Understand these instructions.  Will watch your condition.  Will get help right away if you are not doing well or get worse. Document Released: 10/23/2000 Document Revised: 08/16/2013 Document Reviewed: 05/25/2013 [facility] Patient Information 2015 [place], [place]. This information is not intended to replace advice given to you by your health care provider. Make sure you discuss any questions you have with your health care provider.  Upper Respiratory Infection, Adult An upper respiratory infection (URI) is also sometimes known as the common cold. The upper respiratory tract includes the nose, sinuses, throat, trachea, and bronchi. Bronchi are the airways leading to the lungs. Most people improve within 1 week, but symptoms can last up to 2 weeks. A residual  cough may last even longer.  CAUSES Many different viruses can infect the tissues lining the upper respiratory tract. The tissues become irritated and inflamed and often become very moist. Mucus production is also common. A cold is contagious. You can easily spread the virus to others by oral contact. This includes kissing, sharing a glass, coughing, or sneezing. Touching your mouth or nose and then touching a surface, which is then touched by another person, can also spread the virus. SYMPTOMS  Symptoms typically develop 1 to 3 days after you come in contact with a cold virus. Symptoms vary from person to person. They may include:  Runny nose.  Sneezing.  Nasal congestion.  Sinus irritation.  Sore throat.  Loss of voice (laryngitis).  Cough.  Fatigue.  Muscle aches.  Loss of appetite.  Headache.  Low-grade fever. DIAGNOSIS  You might diagnose your own cold based on familiar symptoms, since most people get a cold  2 to 3 times a year. Your caregiver can confirm this based on your exam. Most importantly, your caregiver can check that your symptoms are not due to another disease such as strep throat, sinusitis, pneumonia, asthma, or epiglottitis. Blood tests, throat tests, and X-rays are not necessary to diagnose a common cold, but they may sometimes be helpful in excluding other more serious diseases. Your caregiver will decide if any further tests are required. RISKS AND COMPLICATIONS  You may be at risk for a more severe case of the common cold if you smoke cigarettes, have chronic heart disease (such as heart failure) or lung disease (such as asthma), or if you have a weakened immune system. The very young and very old are also at risk for more serious infections. Bacterial sinusitis, middle ear infections, and bacterial pneumonia can complicate the common cold. The common cold can worsen asthma and chronic obstructive pulmonary disease (COPD). Sometimes, these complications can require emergency medical care and may be life-threatening. PREVENTION  The best way to protect against getting a cold is to practice good hygiene. Avoid oral or hand contact with people with cold symptoms. Wash your hands often if contact occurs. There is no clear evidence that vitamin C, vitamin E, echinacea, or exercise reduces the chance of developing a cold. However, it is always recommended to get plenty of rest and practice good nutrition. TREATMENT  Treatment is directed at relieving symptoms. There is no cure. Antibiotics are not effective, because the infection is caused by a virus, not by bacteria. Treatment may include:  Increased fluid intake. Sports drinks offer valuable electrolytes, sugars, and fluids.  Breathing heated mist or steam (vaporizer or shower).  Eating chicken soup or other clear broths, and maintaining good nutrition.  Getting plenty of rest.  Using gargles or lozenges for comfort.  Controlling fevers  with ibuprofen or acetaminophen as directed by your caregiver.  Increasing usage of your inhaler if you have asthma. Zinc gel and zinc lozenges, taken in the first 24 hours of the common cold, can shorten the duration and lessen the severity of symptoms. Pain medicines may help with fever, muscle aches, and throat pain. A variety of non-prescription medicines are available to treat congestion and runny nose. Your caregiver can make recommendations and may suggest nasal or lung inhalers for other symptoms.  HOME CARE INSTRUCTIONS   Only take over-the-counter or prescription medicines for pain, discomfort, or fever as directed by your caregiver.  Use a warm mist humidifier or inhale steam from a shower to increase air moisture. This  may keep secretions moist and make it easier to breathe.  Drink enough water and fluids to keep your urine clear or pale yellow.  Rest as needed.  Return to work when your temperature has returned to normal or as your caregiver advises. You may need to stay home longer to avoid infecting others. You can also use a face mask and careful hand washing to prevent spread of the virus. SEEK MEDICAL CARE IF:   After the first few days, you feel you are getting worse rather than better.  You need your caregiver's advice about medicines to control symptoms.  You develop chills, worsening shortness of breath, or brown or red sputum. These may be signs of pneumonia.  You develop yellow or brown nasal discharge or pain in the face, especially when you bend forward. These may be signs of sinusitis.  You develop a fever, swollen neck glands, pain with swallowing, or white areas in the back of your throat. These may be signs of strep throat. SEEK IMMEDIATE MEDICAL CARE IF:   You have a fever.  You develop severe or persistent headache, ear pain, sinus pain, or chest pain.  You develop wheezing, a prolonged cough, cough up blood, or have a change in your usual mucus (if you  have chronic lung disease).  You develop sore muscles or a stiff neck. Document Released: 04/21/2001 Document Revised: 01/18/2012 Document Reviewed: 01/31/2014 [facility] Patient Information 2015 [place], [place]. This information is not intended to replace advice given to you by your health care provider. Make sure you discuss any questions you have with your health care provider.

## 2014-09-19 NOTE — ED Provider Notes (Signed)
CSN: [id number]     Arrival date & time 09/19/14  1408 History   First MD Initiated Contact with Patient 09/19/14 1428     Chief Complaint  Patient presents with  . URI   (Consider location/radiation/quality/duration/timing/severity/associated sxs/prior Treatment) HPI           28 year old female presents for evaluation of cough and congestion. The symptoms initially started approximately one month ago. She had a productive cough. Cough mostly got better but she has continued to have a dry cough. A few days ago the cough started getting worse again. It is productive of a green tinged sputum. She also has nasal congestion. She denies chest pain or shortness of breath. No fever, chills, NVD. No recent travel or sick contacts.  Past Medical History  Diagnosis Date  . Migraine     Frequent  . IBS (irritable bowel syndrome)   . H/O varicella   . Yeast infection   . H/O dysmenorrhea 2009  . Irregular periods/menstrual cycles 2009  . PCOS (polycystic ovarian syndrome) 2009   Past Surgical History  Procedure Laterality Date  . Tongue surgery    . Root canal    . Breast surgery  2012    Breast  Reduction   Family History  Problem Relation Age of Onset  . Diabetes Mother   . Heart attack Father   . Hypertension Father    History  Substance Use Topics  . Smoking status: Never Smoker   . Smokeless tobacco: Never Used  . Alcohol Use: Yes   OB History    No data available     Review of Systems  Constitutional: Negative for fever and chills.  HENT: Positive for congestion. Negative for postnasal drip, rhinorrhea, sinus pressure and sore throat.   Respiratory: Positive for cough. Negative for shortness of breath and wheezing.   Cardiovascular: Negative for chest pain.  All other systems reviewed and are negative.   Allergies  Amoxicillin  Home Medications   Prior to Admission medications   Medication Sig Start Date End Date Taking? Authorizing Provider  albuterol  (PROVENTIL HFA;VENTOLIN HFA) 108 (90 BASE) MCG/ACT inhaler Inhale 2 puffs into the lungs every 4 (four) hours as needed for wheezing. 09/19/14   Liam Graham, PA-C  chlorpheniramine-HYDROcodone (TUSSIONEX PENNKINETIC ER) 10-8 MG/5ML LQCR Take 5 mLs by mouth every 12 (twelve) hours as needed. 07/24/13   Billy Fischer, MD  ciprofloxacin (CIPRO) 500 MG tablet Take 1 tablet (500 mg total) by mouth 2 (two) times daily. 09/08/13   Billy Fischer, MD  cyclobenzaprine (FLEXERIL) 10 MG tablet Take 1 tablet (10 mg total) by mouth 2 (two) times daily as needed for muscle spasms. 03/26/13   Teressa Lower, MD  fluticasone (FLONASE) 50 MCG/ACT nasal spray Place 2 sprays into the nose 2 (two) times daily. 07/24/13   Billy Fischer, MD  guaiFENesin-codeine 100-10 MG/5ML syrup Take 5 mLs by mouth every 4 (four) hours as needed for cough. 09/19/14   Liam Graham, PA-C  ibuprofen (ADVIL,MOTRIN) 800 MG tablet Take 1 tablet (800 mg total) by mouth 3 (three) times daily. 03/26/13   Teressa Lower, MD  norethindrone-ethinyl estradiol 1/35 ([place] 1/35, 28,) tablet Take 1 tablet by mouth daily. 10/05/12   Ena Dawley, MD  Spacer/Aero-Holding Chambers (AEROCHAMBER PLUS FLO-VU LARGE) MISC 1 each by Other route once. 09/19/14   Liam Graham, PA-C  traMADol (ULTRAM) 50 MG tablet Take 1 tablet (50 mg total) by mouth every 6 (six) hours  as needed for pain. 03/26/13   Teressa Lower, MD   BP 103/70 mmHg  Pulse 71  Temp(Src) 98.8 F (37.1 C) (Oral)  Resp 16  SpO2 100%  LMP 09/11/2014 Physical Exam  Constitutional: She is oriented to person, place, and time. Vital signs are normal. She appears well-developed and well-nourished. No distress.  HENT:  Head: Normocephalic and atraumatic.  Right Ear: Tympanic membrane and external ear normal.  Left Ear: Tympanic membrane, external ear and ear canal normal.  Nose: Nose normal. Right sinus exhibits no maxillary sinus tenderness and no frontal sinus tenderness. Left sinus exhibits no  maxillary sinus tenderness and no frontal sinus tenderness.  Mouth/Throat: Uvula is midline, oropharynx is clear and moist and mucous membranes are normal. No oropharyngeal exudate.  Eyes: Conjunctivae are normal. Right eye exhibits no discharge. Left eye exhibits no discharge.  Neck: Normal range of motion. Neck supple.  Cardiovascular: Normal rate, regular rhythm, normal heart sounds and intact distal pulses.  Exam reveals no gallop and no friction rub.   No murmur heard. Pulmonary/Chest: Effort normal and breath sounds normal. No respiratory distress. She has no wheezes. She has no rales.  Lymphadenopathy:    She has no cervical adenopathy.  Neurological: She is alert and oriented to person, place, and time. She has normal strength. Coordination normal.  Skin: Skin is warm and dry. No rash noted. She is not diaphoretic.  Psychiatric: She has a normal mood and affect. Judgment normal.  Nursing note and vitals reviewed.   ED Course  Procedures (including critical care time) Labs Review Labs Reviewed - No data to display  Imaging Review Dg Chest 2 View  09/19/2014   CLINICAL DATA:  Short of breath.  Productive cough for 1 month.  EXAM: CHEST  2 VIEW  COMPARISON:  None.  FINDINGS: Cardiopericardial silhouette within normal limits. Mediastinal contours normal. Trachea midline. No airspace disease or effusion.  IMPRESSION: No active cardiopulmonary disease.   Electronically Signed   By: Dereck Ligas M.D.   On: 09/19/2014 15:25     MDM   1. Bronchitis    Chest x-ray is normal. Most consistent with viral URI. I believe she is just caught another URI. She has no symptoms of pneumonia apart from the cough. No other systemic symptoms. Treat symptomatically with cough suppressant And albuterol inhaler. Follow up when necessary   Meds ordered this encounter  Medications  . DISCONTD: guaiFENesin-codeine 100-10 MG/5ML syrup    Sig: Take 5 mLs by mouth every 4 (four) hours as needed for  cough.    Dispense:  120 mL    Refill:  0    Order Specific Question:  Supervising Provider    Answer:  Melony Overly [id number]  . albuterol (PROVENTIL HFA;VENTOLIN HFA) 108 (90 BASE) MCG/ACT inhaler    Sig: Inhale 2 puffs into the lungs every 4 (four) hours as needed for wheezing.    Dispense:  1 Inhaler    Refill:  0    Order Specific Question:  Supervising Provider    Answer:  Melony Overly [id number]  . Spacer/Aero-Holding Chambers (AEROCHAMBER PLUS FLO-VU LARGE) MISC    Sig: 1 each by Other route once.    Dispense:  1 each    Refill:  0    Order Specific Question:  Supervising Provider    Answer:  Melony Overly [id number]  . guaiFENesin-codeine 100-10 MG/5ML syrup    Sig: Take 5 mLs by mouth every 4 (four) hours as needed for  cough.    Dispense:  120 mL    Refill:  0    Order Specific Question:  Supervising Provider    Answer:  Melony Overly [[id number]]       Liam Graham, PA-C 09/19/14 1600

## 2014-11-12 ENCOUNTER — Other Ambulatory Visit: Payer: Self-pay | Admitting: Obstetrics and Gynecology

## 2014-11-20 ENCOUNTER — Encounter: Payer: Self-pay | Admitting: Family Medicine

## 2014-11-20 ENCOUNTER — Ambulatory Visit (INDEPENDENT_AMBULATORY_CARE_PROVIDER_SITE_OTHER): Payer: BC Managed Care – PPO | Admitting: Family Medicine

## 2014-11-20 VITALS — BP 132/86 | Temp 98.3°F | Ht 64.5 in | Wt 197.0 lb

## 2014-11-20 DIAGNOSIS — J019 Acute sinusitis, unspecified: Secondary | ICD-10-CM

## 2014-11-20 DIAGNOSIS — B9689 Other specified bacterial agents as the cause of diseases classified elsewhere: Secondary | ICD-10-CM

## 2014-11-20 MED ORDER — LEVOFLOXACIN 500 MG PO TABS: 500.0000 mg | ORAL_TABLET | Freq: Every day | ORAL | Status: DC

## 2014-11-20 NOTE — Progress Notes (Signed)
   Subjective:    Patient ID: Marie Singh, female    DOB: 23-Nov-1985, 29 y.o.   MRN: [id number]  Cough This is a new problem. The current episode started more than 1 month ago. Associated symptoms comments: Congestion,.    Started approximately month ago then got progressively worse started get better then got worse again. Now with sinus pressure pain discomfort  Review of Systems  Respiratory: Positive for cough.    denies wheezing denies vomiting diarrhea or high fever     Objective:   Physical Exam  Eardrums normal throat is normal neck supple moderate sinus tenderness lungs clear no crackles      Assessment & Plan:  Viral syndrome Acute rhinosinusitis Antibiotics prescribed Should gradually get better call us if problems

## 2014-12-05 ENCOUNTER — Encounter: Payer: Self-pay | Admitting: Family Medicine

## 2014-12-05 DIAGNOSIS — N870 Mild cervical dysplasia: Secondary | ICD-10-CM | POA: Insufficient documentation

## 2014-12-05 DIAGNOSIS — B977 Papillomavirus as the cause of diseases classified elsewhere: Secondary | ICD-10-CM | POA: Insufficient documentation

## 2015-09-03 ENCOUNTER — Telehealth: Payer: Self-pay | Admitting: Family Medicine

## 2015-09-03 DIAGNOSIS — Z13220 Encounter for screening for lipoid disorders: Secondary | ICD-10-CM

## 2015-09-03 DIAGNOSIS — Z139 Encounter for screening, unspecified: Secondary | ICD-10-CM

## 2015-09-03 NOTE — Telephone Encounter (Signed)
Pt not had any bw since epic, see chart  Call when ready may need copy to go in [place] somewhere  Call when ready

## 2015-09-04 NOTE — Telephone Encounter (Signed)
Lipid, metabolic 7. Nurse's-please talk with patient make sure there is nothing else she is interested in checking ( HIV ? Other tests?)

## 2015-09-04 NOTE — Telephone Encounter (Signed)
Labs ordered. Left message for patient to return call.

## 2015-09-11 NOTE — Telephone Encounter (Signed)
[facility]

## 2015-09-11 NOTE — Telephone Encounter (Signed)
Discussed with pt. She just wants screening bw. No additional testing. Not having any problems and does not want hiv testing. Orders in. Pt aware to go to [facility] to do bw.

## 2015-09-25 DIAGNOSIS — S63103A Unspecified subluxation of unspecified thumb, initial encounter: Secondary | ICD-10-CM | POA: Insufficient documentation

## 2015-10-15 ENCOUNTER — Ambulatory Visit: Payer: BC Managed Care – PPO | Admitting: Family Medicine

## 2015-10-25 ENCOUNTER — Ambulatory Visit: Payer: BC Managed Care – PPO | Admitting: Family Medicine

## 2015-11-05 ENCOUNTER — Ambulatory Visit (INDEPENDENT_AMBULATORY_CARE_PROVIDER_SITE_OTHER): Payer: BC Managed Care – PPO | Admitting: Family Medicine

## 2015-11-05 ENCOUNTER — Encounter: Payer: Self-pay | Admitting: Family Medicine

## 2015-11-05 VITALS — BP 122/80 | Ht 64.5 in | Wt 195.4 lb

## 2015-11-05 DIAGNOSIS — R05 Cough: Secondary | ICD-10-CM | POA: Diagnosis not present

## 2015-11-05 DIAGNOSIS — J45909 Unspecified asthma, uncomplicated: Secondary | ICD-10-CM | POA: Insufficient documentation

## 2015-11-05 DIAGNOSIS — J4520 Mild intermittent asthma, uncomplicated: Secondary | ICD-10-CM

## 2015-11-05 DIAGNOSIS — R059 Cough, unspecified: Secondary | ICD-10-CM

## 2015-11-05 MED ORDER — BENZONATATE 100 MG PO CAPS: 100.0000 mg | ORAL_CAPSULE | Freq: Four times a day (QID) | ORAL | Status: DC | PRN

## 2015-11-05 MED ORDER — FLUTICASONE PROPIONATE HFA 110 MCG/ACT IN AERO: 2.0000 | INHALATION_SPRAY | Freq: Two times a day (BID) | RESPIRATORY_TRACT | Status: DC

## 2015-11-05 MED ORDER — AZITHROMYCIN 250 MG PO TABS: ORAL_TABLET | ORAL | Status: DC
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
## 2015-11-06 ENCOUNTER — Encounter: Payer: Self-pay | Admitting: Family Medicine

## 2015-11-06 LAB — LIPID PANEL
CHOL/HDL RATIO: 3.5 ratio (ref 0.0–4.4)
Cholesterol, Total: 176 mg/dL (ref 100–199)
HDL: 51 mg/dL (ref >39)
LDL CALC: 110 mg/dL — AB (ref 0–99)
TRIGLYCERIDES: 77 mg/dL (ref 0–149)
VLDL Cholesterol Cal: 15 mg/dL (ref 5–40)

## 2015-11-06 LAB — BASIC METABOLIC PANEL
BUN/Creatinine Ratio: 12 (ref 8–20)
BUN: 9 mg/dL (ref 6–20)
CHLORIDE: 98 mmol/L (ref 96–106)
CO2: 23 mmol/L (ref 18–29)
Calcium: 9.4 mg/dL (ref 8.7–10.2)
Creatinine, Ser: 0.73 mg/dL (ref 0.57–1.00)
GFR calc non Af Amer: 111 mL/min/{1.73_m2} (ref >59)
GFR, EST AFRICAN AMERICAN: 128 mL/min/{1.73_m2} (ref >59)
Glucose: 99 mg/dL (ref 65–99)
POTASSIUM: 3.7 mmol/L (ref 3.5–5.2)
SODIUM: 140 mmol/L (ref 134–144)

## 2016-02-14 ENCOUNTER — Encounter: Payer: Self-pay | Admitting: Family Medicine

## 2016-02-14 ENCOUNTER — Ambulatory Visit (INDEPENDENT_AMBULATORY_CARE_PROVIDER_SITE_OTHER): Payer: BC Managed Care – PPO | Admitting: Family Medicine

## 2016-02-14 VITALS — BP 102/70 | Temp 98.1°F | Ht 64.0 in | Wt 184.2 lb

## 2016-02-14 DIAGNOSIS — L500 Allergic urticaria: Secondary | ICD-10-CM

## 2016-02-14 MED ORDER — METHYLPREDNISOLONE ACETATE 40 MG/ML IJ SUSP
40.0000 mg | Freq: Once | INTRAMUSCULAR | Status: AC
  Administered 2016-02-14: 40 mg via INTRAMUSCULAR

## 2016-02-14 MED ORDER — PREDNISONE 20 MG PO TABS: ORAL_TABLET | ORAL | Status: DC

## 2016-02-14 NOTE — Progress Notes (Signed)
   Subjective:    Patient ID: Marie Singh, female    DOB: 01-06-1986, 30 y.o.   MRN: [id number]  Rash This is a new problem. The current episode started in the past 7 days. The problem is unchanged. The rash is diffuse. The rash is characterized by redness and itchiness. It is unknown if there was an exposure to a precipitant. Past treatments include antihistamine. The treatment provided mild relief.   Patient with significant allergic reaction. Having hives over multiple parts of her body in large patches. Present over the past few days. States she was on metronidazole about a week and half ago. States she's also been eating cashews on a regular basis this week. She denies any throat closing wheezing difficulty breathing or passing out no vomiting or diarrhea or fevers. His never had anything like this before no family history of this.   Review of Systems  Skin: Positive for rash.       Objective:   Physical Exam Makes good eye contact not toxic lungs are clear no wheezing heart is regular throat not swelling, extensive urticaria on the abdomen and back. Depo-Medrol shot given      Assessment & Plan:  Allergist referral Prescription for EpiPen given Prednisone taper Cetirizine OTC If progressive troubles or worse follow-up Avoid all nuts until seen by allergists and tested appropriately.

## 2016-02-17 ENCOUNTER — Encounter: Payer: Self-pay | Admitting: Family Medicine

## 2016-04-13 ENCOUNTER — Ambulatory Visit (INDEPENDENT_AMBULATORY_CARE_PROVIDER_SITE_OTHER): Payer: BC Managed Care – PPO | Admitting: Allergy and Immunology

## 2016-04-13 ENCOUNTER — Encounter: Payer: Self-pay | Admitting: Allergy and Immunology

## 2016-04-13 VITALS — BP 122/70 | HR 84 | Temp 98.6°F | Resp 16 | Ht 63.78 in | Wt 180.1 lb

## 2016-04-13 DIAGNOSIS — R059 Cough, unspecified: Secondary | ICD-10-CM

## 2016-04-13 DIAGNOSIS — R05 Cough: Secondary | ICD-10-CM | POA: Diagnosis not present

## 2016-04-13 DIAGNOSIS — H1010 Acute atopic conjunctivitis, unspecified eye: Secondary | ICD-10-CM

## 2016-04-13 DIAGNOSIS — L509 Urticaria, unspecified: Secondary | ICD-10-CM

## 2016-04-13 DIAGNOSIS — Z91018 Allergy to other foods: Secondary | ICD-10-CM

## 2016-04-13 DIAGNOSIS — J309 Allergic rhinitis, unspecified: Secondary | ICD-10-CM

## 2016-04-13 MED ORDER — ALBUTEROL SULFATE HFA 108 (90 BASE) MCG/ACT IN AERS: INHALATION_SPRAY | RESPIRATORY_TRACT | Status: DC

## 2016-04-13 NOTE — Progress Notes (Signed)
NEW PATIENT NOTE  RE: GENETTE Singh MRN: [id number] DOB: 19-Nov-1985 [facility] [place] [address] [place] [address] Date of Office Visit: 04/13/2016  Dear Marie Drown, MD:  I had the pleasure of seeing Marie Singh today in initial evaluation, as you recall-- Subjective:  Marie Singh is a 30 y.o. female who presents today for Urticaria  Assessment:   1. Allergic rhinoconjunctivitis, seasonal and perennial hypersensitivities.   2. Cough, clear lung exam and excellent in office spirometry, probable previous episode of bronchospasm.  3. Tree nut allergy.   4. Hives, As related with #3.    Plan:   Meds ordered this encounter  Medications  . albuterol (PROAIR HFA) 108 (90 Base) MCG/ACT inhaler    Sig: 2 puffs every 4 hours as needed for cough or wheeze.  May use 2 puffs 10-20 minutes prior to exercise.    Dispense:  1 Inhaler    Refill:  1   Patient Instructions  1. Avoidance: Mite, Mold and Pollen/cockroach and all TREE nuts. 2. Antihistamine: Zyrtec 10 mg by mouth once daily for runny nose or itching. 3. Nasal Spray: Rhinocort AQ 1-2 spray(s) each nostril once daily for stuffy nose or drainage. 4. Inhalers:  Rescue: Ventolin 2 puffs every 4 hours as needed for cough or wheeze.       -May use 2 puffs 10-20 minutes prior to exercise. 5. Eye Drops:  Zaditor one drop(s) each eye twice daily for itchy eyes as needed. 4. Other: Information on allergen immunotherapy.        FARE information on tree nuts      EpiPen/Benadryl as needed---fill current RX previously written. 5. Nasal Saline wash each evening at shower time.   6. Follow up Visit: 4-6 weeks or sooner if needed.    HPI: [name] presents to the office in initial evaluation of recurring upper respiratory symptoms and new concern for food allergy.   She describes 15 year history of rhinorrhea, congestion, sneezing, sinus pressure, itchy watery eyes intermittently associated with  cough.  Her symptoms typically are spring and fall seasons and have been associated with chest congestion/tightness where used albuterol in 2016 on a few occasions.  She does not recall exercise induced or nocturnal difficulty, disrupted sleep or snoring, but only found partial relief from Zyrtec, Claritin and Allegra.  In addition late March or early April she describes difficulty with pruritic hive-like rash areas over several days.  She had ingested cashews in a large quantity over several days when about day 3, she began with itching on her arms, legs and stomach without associated swelling of her lips/tongue or throat.  She had increasing rash in her face, therefore, saw primary MD who prescribed Benadryl and prednisone (and given Rx for EpiPen).  Within 48 hours had great improvement, but is avoiding nuts since.  In addition, she describes pollen, dust, outdoors and fluctuant weather patterns, as provoking factors for her respiratory symptoms, but denies any reflux or sinus infections.  She does not recall tick bites or difficulty with any other foods, though no other restrictions to her diet.  Denies ED or Urgent care visits or antibiotic courses.  Medical History: Past Medical History  Diagnosis Date  . Migraine     Frequent  . IBS (irritable bowel syndrome)   . H/O varicella   . Yeast infection   . H/O dysmenorrhea 2009  . Irregular periods/menstrual cycles 2009  . PCOS (polycystic ovarian syndrome) 2009  .  Allergy    Surgical History: Past Surgical History  Procedure Laterality Date  . Tongue surgery    . Root canal    . Breast surgery  2012    Breast  Reduction   Family History: Family History  Problem Relation Age of Onset  . Diabetes Mother   . Allergic rhinitis Mother   . AAA (abdominal aortic aneurysm) Mother   . Food Allergy Mother   . Heart attack Father   . Hypertension Father   . Angioedema Neg Hx   . Asthma Neg Hx   . Eczema Neg Hx   . Immunodeficiency Neg Hx     . Urticaria Neg Hx   . Allergic rhinitis Sister    Social History: Social History  . Marital Status: Single    Spouse Name: N/A  . Number of Children: N/A  . Years of Education: N/A   Social History Main Topics  . Smoking status: Never Smoker   . Smokeless tobacco: Never Used  . Alcohol Use: Yes  . Drug Use: Not on file  . 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  Sherina has a current medication list which includes the following prescription(s): albuterol.   Drug Allergies: Allergies  Allergen Reactions  . Amoxicillin  Hives in her first year of life (unknown severity)    Can take cephalosporins   Environmental History: [place] lives in a >73 year old house for 3 years with carpet/wood floors, with central heat and air; stuffed mattress, non-feather pillow/comforter without humidifier, pets or smokers.  (Previous history of a dog and basement water damage).   Review of Systems  Constitutional: Negative for fever, weight loss and malaise/fatigue.       History of childhood varicella disease.  HENT: Positive for congestion. Negative for ear pain, hearing loss, nosebleeds and sore throat.        History migraine headaches as a teenager.  Eyes: Negative for discharge and redness.  Respiratory: Negative for shortness of breath.        Denies history of pneumonia.  Gastrointestinal: Negative for heartburn, nausea, vomiting, abdominal pain, diarrhea and constipation.  Genitourinary: Negative.   Musculoskeletal: Negative for myalgias and joint pain.  Skin: Positive for rash (Hives (see HPI).). Negative for itching.  Neurological: Negative.  Negative for dizziness, seizures, weakness and headaches.  Endo/Heme/Allergies: Positive for environmental allergies.       Denies sensitivity to aspirin, NSAIDs, stinging insects,  latex, jewelry and cosmetics.  Immunological:  No chronic or recurring infections. Objective:   Filed Vitals:   04/13/16 1359  BP: 122/70  Pulse: 84  Temp: 98.6 F (37 C)  Resp: 16   SpO2 Readings from Last 1 Encounters:  04/13/16 98%   Physical Exam  Constitutional: She is well-developed, well-nourished, and in no distress.  HENT:  Head: Atraumatic.  Right Ear: Tympanic membrane and ear canal normal.  Left Ear: Tympanic membrane and ear canal normal.  Nose: Mucosal edema present. No rhinorrhea. No epistaxis.  Mouth/Throat: Oropharynx is clear and moist and mucous membranes are normal. No oropharyngeal exudate, posterior oropharyngeal edema or posterior oropharyngeal erythema.  Eyes: Conjunctivae are normal.  Neck: Neck supple.  Cardiovascular: Normal rate, S1 normal and S2 normal.   No murmur heard. Pulmonary/Chest: Effort normal. She has no wheezes. She has no rhonchi. She has no rales.  Abdominal: Soft. Normal appearance and bowel sounds  are normal.  Musculoskeletal: She exhibits no edema.  Lymphadenopathy:    She has no cervical adenopathy.  Neurological: She is alert.  Skin: Skin is warm and intact. No rash noted. No cyanosis. Nails show no clubbing.   Diagnostics: Spirometry:  FVC 3.56--- 119%, FEV1 3.01--- 117%.    Skin testing:  Very strong reactivity to dust mite, oak tree and selected grass pollens, strong reactivity to cockroach and multiple weed pollens and mild reactivity to selected mold species and selected tree pollens.    Roselyn M. Ishmael Holter, MD   cc: Sallee Lange, MD

## 2016-04-13 NOTE — Patient Instructions (Addendum)
Take Home Sheet  1. Avoidance: Mite, Mold and Pollen/cockroach and TREE nuts.   2. Antihistamine: Zyrtec 10 mg by mouth once daily for runny nose or itching.   3. Nasal Spray: Rhinocort AQ 1-2 spray(s) each nostril once daily for stuffy nose or drainage.    4. Inhalers:  Rescue: Ventolin 2 puffs every 4 hours as needed for cough or wheeze.       -May use 2 puffs 10-20 minutes prior to exercise.    5. Eye Drops:  Zaditor one drop(s) each eye twice daily for itchy eyes as needed.   4. Other: Information on allergen immunotherapy.        FARE information on tree nuts      EpiPen/Benadryl as needed.    5. Nasal Saline wash each evening at shower time.     6. Follow up Visit: 4-6 weeks or sooner if needed.     Websites that have reliable Patient information: 1. American Academy of Asthma, Allergy, & Immunology: www.aaaai.org 2. Food Allergy Network: www.foodallergy.org 3. Mothers of Asthmatics: www.aanma.org 4. [place]: DiningCalendar.de 5. American College of Allergy, Asthma, & Immunology: https://robertson.info/ or www.acaai.org  Control of House Dust Mite Allergen  House dust mites play a major role in allergic asthma and rhinitis.  They occur in environments with high humidity wherever human skin, the food for dust mites is found. High levels have been detected in dust obtained from mattresses, pillows, carpets, upholstered furniture, bed covers, clothes and soft toys.  The principal allergen of the house dust mite is found in its feces.  A gram of dust may contain 1,000 mites and 250,000 fecal particles.  Mite antigen is easily measured in the air during house cleaning activities.  1. Encase mattresses, including the box spring, and pillow, in an air tight cover.  Seal the zipper end of the encased mattresses with wide adhesive tape. 2. Wash the bedding in water of 130 degrees Farenheit weekly.  Avoid cotton comforters/quilts and flannel  bedding: the most ideal bed covering is the dacron comforter. 3. Remove all upholstered furniture from the bedroom. 4. Remove carpets, carpet padding, rugs, and non-washable window drapes from the bedroom.  Wash drapes weekly or use plastic window coverings. 5. Remove all non-washable stuffed toys from the bedroom.  Wash stuffed toys weekly. 6. Have the room cleaned frequently with a vacuum cleaner and a damp dust-mop.  The patient should not be in a room which is being cleaned and should wait 1 hour after cleaning before going into the room. 7. Close and seal all heating outlets in the bedroom.  Otherwise, the room will become filled with dust-laden air.  An electric heater can be used to heat the room. 8. Reduce indoor humidity to less than 50%.  Do not use a humidifier.  Reducing Pollen Exposure  The American Academy of Allergy, Asthma and Immunology suggests the following steps to reduce your exposure to pollen during allergy seasons.  9. Do not hang sheets or clothing out to dry; pollen may collect on these items. 10. Do not mow lawns or spend time around freshly cut grass; mowing stirs up pollen. 11. Keep windows closed at night.  Keep car windows closed while driving. 12. Minimize morning activities outdoors, a time when pollen counts are usually at their highest. 13. Stay indoors as much as possible when pollen counts or humidity is high and on windy days when pollen tends to remain in the air longer. 14. Use  air conditioning when possible.  Many air conditioners have filters that trap the pollen spores. 15. Use a HEPA room air filter to remove pollen form the indoor air you breathe.  Control of Mold Allergen  Mold and fungi can grow on a variety of surfaces provided certain temperature and moisture conditions exist.  Outdoor molds grow on plants, decaying vegetation and soil.  The major outdoor mold, Alternaria dn Cladosporium, are found in very high numbers during hot and dry  conditions.  Generally, a late Summer - Fall peak is seen for common outdoor fungal spores.  Rain will temporarily lower outdoor mold spore count, but counts rise rapidly when the rainy period ends.  The most important indoor molds are Aspergillus and Penicillium.  Dark, humid and poorly ventilated basements are ideal sites for mold growth.  The next most common sites of mold growth are the bathroom and the kitchen.  Outdoor [employer] 1. Use air conditioning and keep windows closed 2. Avoid exposure to decaying vegetation. 3. Avoid leaf raking. 4. Avoid grain handling. 5. Consider wearing a face mask if working in moldy areas.  Indoor Mold Control 1. Maintain humidity below 50%. 2. Clean washable surfaces with 5% bleach solution. 3. Remove sources e.g. Contaminated carpets.  Control of Cockroach Allergen  Cockroach allergen has been identified as an important cause of acute attacks of asthma, especially in urban settings.  There are fifty-five species of cockroach that exist in the [place], however only three, the [place], [place] species produce allergen that can affect patients with Asthma.  Allergens can be obtained from fecal particles, egg casings and secretions from cockroaches.  1. Remove food sources. 2. Reduce access to water. 3. Seal access and entry points. 4. Spray runways with 0.5-1% Diazinon or Chlorpyrifos 5. Blow boric acid power under stoves and refrigerator. 6. Place bait stations (hydramethylnon) at feeding sites.

## 2016-04-26 ENCOUNTER — Encounter: Payer: Self-pay | Admitting: Allergy and Immunology

## 2016-11-05 ENCOUNTER — Ambulatory Visit: Payer: BC Managed Care – PPO | Admitting: Family Medicine

## 2017-09-06 ENCOUNTER — Other Ambulatory Visit: Payer: Self-pay | Admitting: Family Medicine

## 2017-09-17 ENCOUNTER — Other Ambulatory Visit: Payer: Self-pay

## 2017-10-04 ENCOUNTER — Other Ambulatory Visit: Payer: Self-pay | Admitting: Family Medicine

## 2018-05-20 ENCOUNTER — Encounter: Payer: Self-pay | Admitting: Family Medicine

## 2018-05-20 ENCOUNTER — Ambulatory Visit: Payer: BC Managed Care – PPO | Admitting: Family Medicine

## 2018-05-20 VITALS — BP 122/78 | Ht 63.0 in | Wt 192.6 lb

## 2018-05-20 DIAGNOSIS — Z79899 Other long term (current) drug therapy: Secondary | ICD-10-CM | POA: Diagnosis not present

## 2018-05-20 DIAGNOSIS — R5383 Other fatigue: Secondary | ICD-10-CM | POA: Diagnosis not present

## 2018-05-20 DIAGNOSIS — Z1329 Encounter for screening for other suspected endocrine disorder: Secondary | ICD-10-CM | POA: Diagnosis not present

## 2018-05-20 DIAGNOSIS — N926 Irregular menstruation, unspecified: Secondary | ICD-10-CM

## 2018-05-20 DIAGNOSIS — Z13220 Encounter for screening for lipoid disorders: Secondary | ICD-10-CM

## 2018-05-20 LAB — POCT URINE PREGNANCY: Preg Test, Ur: NEGATIVE

## 2018-05-20 NOTE — Progress Notes (Signed)
   Subjective:    Patient ID: Marie Singh, female    DOB: [birth year], 32 y.o.   MRN: [id number]  patient arrives with multiple concerns  Headache    The current episode started 1 to 4 weeks ago. The pain is located in the frontal region. The pain quality is not similar to prior headaches. The quality of the pain is described as dull. Associated symptoms comments: Elevated BP, has had eye pressure.  Pt was on Prednisone beginning of June and originally thought this was a side effect. Pt states she missed her period, took a pregnancy test but that was negative; is having cramps.  BP 121/82 when pt took it a [employer]  pos hx of migraines, this h a does not feel like that, kind of nnoying, frontal , steady, in nature  Had to take a round of prednisone for the ankle, and at first thpugt it was related  BP elevated at times when cking at [employer], elev for pt. wodered it f it was related  preg test is negative  Off for the summer, lounging and travelling, teaches h s math   Teaches [employer]       Pos fam hx of high blood pressure  Some fm hx of heart issues  lus smoking  Energy level vrall feeling tired and dimished,  Not feeling as well   Usually exercises but not lately due to ankle inury, hoping to get back in   Not on bcp at t his time   Review of Systems  Neurological: Positive for headaches.       Objective:   Physical Exam   Alert and oriented, vitals reviewed and stable, NAD ENT-TM's and ext canals WNL bilat via otoscopic exam Soft palate, tonsils and post pharynx WNL via oropharyngeal exam Neck-symmetric, no masses; thyroid nonpalpable and nontender Pulmonary-no tachypnea or accessory muscle use; Clear without wheezes via auscultation Card--no abnrml murmurs, rhythm reg and rate WNL Carotid pulses symmetric, without bruits       Assessment & Plan:  1 question impression fatigue etiology unclear discussed has not had blood work for quite some  time  2.  Several days late on her menstrual cycle.  Was on protracted course of prednisone a couple weeks ago.  May be related discussed negative pregnancy test  3.  Mild elevation of blood pressure for this patient discussed nonoperative meds  4.  Mild tension headache patient notes related to fatigue  Appropriate blood work.  Exercise strongly encouraged.  Further recommendations based on blood work

## 2018-06-14 LAB — CBC WITH DIFFERENTIAL/PLATELET
Basophils Absolute: 0 10*3/uL (ref 0.0–0.2)
Basos: 0 %
EOS (ABSOLUTE): 0 10*3/uL (ref 0.0–0.4)
EOS: 0 %
Hematocrit: 34.4 % (ref 34.0–46.6)
Hemoglobin: 11.7 g/dL (ref 11.1–15.9)
Immature Grans (Abs): 0 10*3/uL (ref 0.0–0.1)
Immature Granulocytes: 0 %
LYMPHS ABS: 1.8 10*3/uL (ref 0.7–3.1)
Lymphs: 28 %
MCH: 26.7 pg (ref 26.6–33.0)
MCHC: 34 g/dL (ref 31.5–35.7)
MCV: 79 fL (ref 79–97)
Monocytes Absolute: 0.4 10*3/uL (ref 0.1–0.9)
Monocytes: 7 %
Neutrophils Absolute: 4 10*3/uL (ref 1.4–7.0)
Neutrophils: 65 %
Platelets: 272 10*3/uL (ref 150–450)
RBC: 4.38 x10E6/uL (ref 3.77–5.28)
RDW: 14.2 % (ref 12.3–15.4)
WBC: 6.2 10*3/uL (ref 3.4–10.8)

## 2018-06-14 LAB — LIPID PANEL
CHOL/HDL RATIO: 3 ratio (ref 0.0–4.4)
Cholesterol, Total: 160 mg/dL (ref 100–199)
HDL: 54 mg/dL (ref >39)
LDL Calculated: 90 mg/dL (ref 0–99)
Triglycerides: 82 mg/dL (ref 0–149)
VLDL CHOLESTEROL CAL: 16 mg/dL (ref 5–40)

## 2018-06-14 LAB — HEPATIC FUNCTION PANEL
ALT: 13 IU/L (ref 0–32)
AST: 17 IU/L (ref 0–40)
Albumin: 4.3 g/dL (ref 3.5–5.5)
Alkaline Phosphatase: 63 IU/L (ref 39–117)
Bilirubin Total: 0.4 mg/dL (ref 0.0–1.2)
Bilirubin, Direct: 0.13 mg/dL (ref 0.00–0.40)
Total Protein: 7 g/dL (ref 6.0–8.5)

## 2018-06-14 LAB — BASIC METABOLIC PANEL
BUN/Creatinine Ratio: 11 (ref 9–23)
BUN: 8 mg/dL (ref 6–20)
CALCIUM: 9.1 mg/dL (ref 8.7–10.2)
CO2: 21 mmol/L (ref 20–29)
Chloride: 103 mmol/L (ref 96–106)
Creatinine, Ser: 0.7 mg/dL (ref 0.57–1.00)
GFR calc Af Amer: 133 mL/min/{1.73_m2} (ref >59)
GFR, EST NON AFRICAN AMERICAN: 115 mL/min/{1.73_m2} (ref >59)
GLUCOSE: 85 mg/dL (ref 65–99)
Potassium: 3.7 mmol/L (ref 3.5–5.2)
Sodium: 137 mmol/L (ref 134–144)

## 2018-06-14 LAB — TSH: TSH: 2.8 u[IU]/mL (ref 0.450–4.500)

## 2018-06-19 ENCOUNTER — Encounter: Payer: Self-pay | Admitting: Family Medicine

## 2019-09-23 ENCOUNTER — Other Ambulatory Visit: Payer: Self-pay | Admitting: Family Medicine

## 2019-09-25 ENCOUNTER — Other Ambulatory Visit: Payer: Self-pay | Admitting: Family Medicine

## 2019-09-26 NOTE — Telephone Encounter (Signed)
Virtual visit scheduled 12/2. She would also like to have tessalon pearls called in as well please.

## 2019-09-26 NOTE — Telephone Encounter (Signed)
Please contact pt to set up appt. Then may route back to nurses. Thank you

## 2019-09-26 NOTE — Telephone Encounter (Signed)
lvm to call and schedule appt

## 2019-10-11 ENCOUNTER — Other Ambulatory Visit: Payer: Self-pay

## 2019-10-11 ENCOUNTER — Ambulatory Visit (INDEPENDENT_AMBULATORY_CARE_PROVIDER_SITE_OTHER): Payer: BC Managed Care – PPO | Admitting: Family Medicine

## 2019-10-11 DIAGNOSIS — J4520 Mild intermittent asthma, uncomplicated: Secondary | ICD-10-CM

## 2019-10-11 MED ORDER — FLOVENT HFA 110 MCG/ACT IN AERO: INHALATION_SPRAY | RESPIRATORY_TRACT | 5 refills | Status: DC

## 2019-10-11 MED ORDER — ALBUTEROL SULFATE HFA 108 (90 BASE) MCG/ACT IN AERS: INHALATION_SPRAY | RESPIRATORY_TRACT | 5 refills | Status: DC

## 2019-10-11 MED ORDER — BENZONATATE 100 MG PO CAPS: 100.0000 mg | ORAL_CAPSULE | Freq: Four times a day (QID) | ORAL | 3 refills | Status: DC | PRN

## 2019-10-11 NOTE — Progress Notes (Signed)
   Subjective:    Patient ID: Marie Singh, female    DOB: [birth year], 33 y.o.   MRN: [id number]  HPI Pt is needing refills on Albuterol Inhaler and Flovent Inhaler. Pt would also like refills on Tessalon due to coughing for about a week. Pt states she went to the beach this past weekend and when she came home she began to cough; no coughing at the beach (believes the cough is related to her house). Patient with inflammatory issues within the lungs causing her to cough.  This happens with change of seasons.  She currently started the Flovent about a week ago she states that is helping some does not have an albuterol inhaler currently.  Denies any other particular troubles currently.  No fevers chills. Virtual Visit via Video Note  I connected with Marie Singh on 10/11/19 at 10:00 AM EST by a video enabled telemedicine application and verified that I am speaking with the correct person using two identifiers.  Location: Patient: home Provider: office   I discussed the limitations of evaluation and management by telemedicine and the availability of in person appointments. The patient expressed understanding and agreed to proceed.  History of Present Illness:    Observations/Objective:   Assessment and Plan:   Follow Up Instructions:    I discussed the assessment and treatment plan with the patient. The patient was provided an opportunity to ask questions and all were answered. The patient agreed with the plan and demonstrated an understanding of the instructions.   The patient was advised to call back or seek an in-person evaluation if the symptoms worsen or if the condition fails to improve as anticipated.  I provided 15 minutes of non-face-to-face time during this encounter.   Vicente Males, LPN    Review of Systems  Constitutional: Negative for activity change and fever.  HENT: Negative for congestion, ear pain and rhinorrhea.   Eyes: Negative for discharge.   Respiratory: Positive for cough. Negative for shortness of breath and wheezing.   Cardiovascular: Negative for chest pain.  Gastrointestinal: Negative for abdominal pain, diarrhea and nausea.       Objective:   Physical Exam    Patient had virtual visit Appears to be in no distress Atraumatic Neuro able to relate and oriented No apparent resp distress Color normal    Preventative health exam and lab work recommended for spine Assessment & Plan:  Patient does not appear to be in any type of distress I do believe she has reactive airway and would benefit from Flovent on a regular basis during times of change of season Albuterol for rescue inhaler Rationale discussed warning signs discussed Flu shot recommended patient defers

## 2020-01-24 ENCOUNTER — Other Ambulatory Visit: Payer: Self-pay | Admitting: Obstetrics & Gynecology

## 2020-02-08 ENCOUNTER — Ambulatory Visit: Payer: BC Managed Care – PPO | Attending: Internal Medicine

## 2020-02-08 DIAGNOSIS — Z23 Encounter for immunization: Secondary | ICD-10-CM

## 2020-02-08 NOTE — Progress Notes (Signed)
   Covid-19 Vaccination Clinic  Name:  Marie Singh    MRN: [id number] DOB: 08/11/1986  02/08/2020  [name] was observed post Covid-19 immunization for 30 minutes based on pre-vaccination screening without incident. She was provided with Vaccine Information Sheet and instruction to access the V-Safe system.   [name] was instructed to call 911 with any severe reactions post vaccine: Marland Kitchen Difficulty breathing  . Swelling of face and throat  . A fast heartbeat  . A bad rash all over body  . Dizziness and weakness   Immunizations Administered    Name Date Dose VIS Date Route   Pfizer COVID-19 Vaccine 02/08/2020  2:00 PM 0.3 mL 10/20/2019 Intramuscular   Manufacturer: [place]   Lot: [id number]   Fults: [id number]    pt stated arms feels tingly but has had similar symptoms post other vaccines. Will monitor and contact PCP if worsens or persists.

## 2020-03-05 ENCOUNTER — Ambulatory Visit: Payer: BC Managed Care – PPO | Attending: Internal Medicine

## 2020-03-05 DIAGNOSIS — Z23 Encounter for immunization: Secondary | ICD-10-CM

## 2020-03-05 NOTE — Progress Notes (Signed)
   Covid-19 Vaccination Clinic  Name:  Marie Singh    MRN: [id number] DOB: [birth year]  03/05/2020  Marie Singh was observed post Covid-19 immunization for 15 minutes without incident. She was provided with Vaccine Information Sheet and instruction to access the V-Safe system.   Marie Singh was instructed to call 911 with any severe reactions post vaccine: Marland Kitchen Difficulty breathing  . Swelling of face and throat  . A fast heartbeat  . A bad rash all over body  . Dizziness and weakness   Immunizations Administered    Name Date Dose VIS Date Route   Pfizer COVID-19 Vaccine 03/05/2020  8:17 AM 0.3 mL 01/03/2019 Intramuscular   Manufacturer: [place]   Lot: [id number]   Hand: [id number]

## 2020-03-15 ENCOUNTER — Other Ambulatory Visit: Payer: Self-pay | Admitting: Obstetrics & Gynecology

## 2020-03-29 HISTORY — PX: CERVICAL BIOPSY  W/ LOOP ELECTRODE EXCISION: SUR135

## 2020-04-11 ENCOUNTER — Telehealth: Payer: Self-pay | Admitting: Family Medicine

## 2020-04-11 NOTE — Telephone Encounter (Signed)
Pt calling requesting a spacer for her inhaler.   [facility] #[id number] - [place], [place] - [address]

## 2020-04-12 NOTE — Telephone Encounter (Signed)
Script written out, awaiting signature. Will fax when signed. Pt is aware.

## 2020-04-12 NOTE — Telephone Encounter (Signed)
May have spacer diagnosis reactive airway

## 2020-04-21 ENCOUNTER — Other Ambulatory Visit: Payer: Self-pay

## 2020-04-21 ENCOUNTER — Encounter (HOSPITAL_COMMUNITY): Payer: Self-pay

## 2020-04-21 ENCOUNTER — Encounter (HOSPITAL_COMMUNITY): Payer: Self-pay | Admitting: Emergency Medicine

## 2020-04-21 ENCOUNTER — Ambulatory Visit (HOSPITAL_COMMUNITY)
Admission: EM | Admit: 2020-04-21 | Discharge: 2020-04-21 | Disposition: A | Discharge status: Home / Self Care | Payer: BC Managed Care – PPO | Source: Home / Self Care

## 2020-04-21 ENCOUNTER — Emergency Department (HOSPITAL_COMMUNITY)
Admission: EM | Admit: 2020-04-21 | Discharge: 2020-04-22 | Disposition: A | Discharge status: Home / Self Care | Payer: BC Managed Care – PPO | Attending: Emergency Medicine | Admitting: Emergency Medicine

## 2020-04-21 DIAGNOSIS — R21 Rash and other nonspecific skin eruption: Secondary | ICD-10-CM | POA: Insufficient documentation

## 2020-04-21 DIAGNOSIS — L509 Urticaria, unspecified: Secondary | ICD-10-CM | POA: Diagnosis not present

## 2020-04-21 DIAGNOSIS — R600 Localized edema: Secondary | ICD-10-CM

## 2020-04-21 DIAGNOSIS — T7840XA Allergy, unspecified, initial encounter: Secondary | ICD-10-CM | POA: Diagnosis not present

## 2020-04-21 DIAGNOSIS — Z5321 Procedure and treatment not carried out due to patient leaving prior to being seen by health care provider: Secondary | ICD-10-CM | POA: Diagnosis not present

## 2020-04-21 MED ORDER — PREDNISONE 10 MG (21) PO TBPK: ORAL_TABLET | ORAL | 0 refills | Status: DC

## 2020-04-21 NOTE — ED Triage Notes (Signed)
Pt presents to UC for hives/alergic reaction. Pt states she is allergic to tree nuts but did not come into contact. Pt states reaction began on Tuesday while she was out of the country ([place] Pt was seen in [place] pt states she was given a "shot of something unsure what". Pt states that the shot did help. Pt denies throat tightness, or difficulty swallowing, or difficulty breathing. Pt endorses joint tightness in ankles and hands.

## 2020-04-21 NOTE — Discharge Instructions (Signed)
Take the prednisone taper as prescribed.  Benadryl for itching as needed Make sure you are elevating her feet to decrease swelling Follow up as needed for continued or worsening symptoms

## 2020-04-21 NOTE — ED Triage Notes (Signed)
Patient reports persistent itchy rashes/hives at back arms and legs onset Tuesday , airway intact /no oral swelling , respirations unlabored , no fever or chills , patient added joint pains today .

## 2020-04-22 NOTE — ED Notes (Signed)
Pt left without being seen. Pt was tired of the wait.

## 2020-04-23 ENCOUNTER — Ambulatory Visit: Payer: BC Managed Care – PPO | Admitting: Family Medicine

## 2020-04-23 ENCOUNTER — Other Ambulatory Visit: Payer: Self-pay

## 2020-04-23 VITALS — BP 110/72 | Temp 97.2°F | Wt 207.6 lb

## 2020-04-23 DIAGNOSIS — L509 Urticaria, unspecified: Secondary | ICD-10-CM | POA: Diagnosis not present

## 2020-04-23 NOTE — Progress Notes (Signed)
   Subjective:    Patient ID: Marie Singh, female    DOB: 01/09/1986, 34 y.o.   MRN: [id number]  HPI  Patient arrives for a follow up on allergic reaction. Patient states she had itching and hives that came and went. Patient was out of town and saw dr while in [place] and also went to urgent care when she got back in town. Patient on prednisone and doing better. Patient states that this occurred after being on metronidazole 2 separate rounds she relates hives itching she showed me multiple pictures of urticaria on the arms face upper back and lower legs upper legs and abdomen  Patient denied any respiratory difficulty breathing difficulty. 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  No reasonable tests available to check for this will monitor we will touch base with allergist

## 2020-04-23 NOTE — ED Provider Notes (Signed)
[facility]    CSN: [id number] Arrival date & time: 04/21/20  1706      History   Chief Complaint Chief Complaint  Patient presents with  . Rash    HPI Marie Singh is a 34 y.o. female.   Patient is a 34 year old female that presents today with rash.  Reporting the rash started when she was in [place] this past Tuesday.  The rash has been constant, waxing and waning.  She was given some sort of injection in [place] which did relieve the symptoms for approximately a day or 2.  Since being back to the states the rash has been worsening.  The rash has been moving throughout various parts of her body and is very itchy.  Denies any associated oral swelling, throat swelling, shortness of breath.  She has had some mild bilateral pedal edema.  No calf pain or swelling.  No chest pain or shortness of breath.  No history of DVT or PEs.  Denies any fever, joint pain. Denies any recent changes in lotions, detergents, foods or other possible irritants. No recent travel. Nobody else at home has the rash. Patient has been outside but denies any contact with plants or insects. No new foods or medications.   ROS per HPI      Past Medical History:  Diagnosis Date  . Allergy   . H/O dysmenorrhea 2009  . H/O varicella   . IBS (irritable bowel syndrome)   . Irregular periods/menstrual cycles 2009  . Migraine    Frequent  . PCOS (polycystic ovarian syndrome) 2009  . Yeast infection     Patient Active Problem List   Diagnosis Date Noted  . Reactive airway disease 11/05/2015  . Subluxation of thumb 09/25/2015  . Dysplasia of cervix, low grade (CIN 1) 12/05/2014  . 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 BREAST SURGERY  2012   Breast  Reduction  . ROOT CANAL    . TONGUE SURGERY      OB History   No obstetric history on file.      Home Medications    Prior to Admission medications   Medication Sig Start Date End Date  Taking? Authorizing Provider  albuterol (PROAIR HFA) 108 (90 Base) MCG/ACT inhaler 2 puffs every 4 hours as needed for cough or wheeze.  May use 2 puffs 10-20 minutes prior to exercise. 10/11/19  Yes [name], MD  fluticasone (FLOVENT HFA) 110 MCG/ACT inhaler TAKE 2 PUFFS BY MOUTH TWICE A DAY 10/11/19  Yes Luking, Scott A, MD  benzonatate (TESSALON PERLES) 100 MG capsule Take 1 capsule (100 mg total) by mouth every 6 (six) hours as needed for cough. 10/11/19   [name], MD  EPINEPHrine 0.3 mg/0.3 mL IJ SOAJ injection Inject 0.3 mg into the muscle once.    [provider]  predniSONE (STERAPRED UNI-PAK 21 TAB) 10 MG (21) TBPK tablet 6 tabs for 1 day, then 5 tabs for 1 das, then 4 tabs for 1 day, then 3 tabs for 1 day, 2 tabs for 1 day, then 1 tab for 1 day 04/21/20   Orvan July, NP  Spacer/Aero-Holding Chambers (AEROCHAMBER PLUS FLO-VU LARGE) MISC 1 each by Other route once. 09/19/14   Liam Graham, PA-C    Family History Family History  Problem Relation Age of Onset  . Diabetes Mother   . Allergic rhinitis Mother   . AAA (abdominal aortic  aneurysm) Mother   . Food Allergy Mother   . Heart attack Father   . Hypertension Father   . Allergic rhinitis Sister   . Angioedema Neg Hx   . Asthma Neg Hx   . Eczema Neg Hx   . Immunodeficiency Neg Hx   . Urticaria Neg Hx     Social History Social History   Tobacco Use  . Smoking status: Never Smoker  . Smokeless tobacco: Never Used  Substance Use Topics  . Alcohol use: Yes  . Drug use: Not on file     Allergies   Amoxicillin and Justicia adhatoda (malabar nut tree) [justicia adhatoda]   Review of Systems Review of Systems   Physical Exam Triage Vital Signs ED Triage Vitals  Enc Vitals Group     BP 04/21/20 1721 123/68     Pulse Rate 04/21/20 1721 88     Resp 04/21/20 1721 18     Temp 04/21/20 1721 98.2 F (36.8 C)     Temp src --      SpO2 04/21/20 1721 100 %     Weight --      Height --       Head Circumference --      Peak Flow --      Pain Score 04/21/20 1715 0     Pain Loc --      Pain Edu? --      Excl. in [place]? --    No data found.  Updated Vital Signs BP 123/68   Pulse 88   Temp 98.2 F (36.8 C)   Resp 18   LMP 04/17/2020 (Exact Date)   SpO2 100%   Visual Acuity Right Eye Distance:   Left Eye Distance:   Bilateral Distance:    Right Eye Near:   Left Eye Near:    Bilateral Near:     Physical Exam Vitals and nursing note reviewed.  Constitutional:      General: She is not in acute distress.    Appearance: Normal appearance. She is not ill-appearing, toxic-appearing or diaphoretic.  HENT:     Head: Normocephalic.     Nose: Nose normal.  Eyes:     Conjunctiva/sclera: Conjunctivae normal.  Pulmonary:     Effort: Pulmonary effort is normal.  Musculoskeletal:        General: Normal range of motion.     Cervical back: Normal range of motion.     Comments: Bilateral 1+ pedal edema  Skin:    General: Skin is warm and dry.     Findings: Rash present.     Comments: Urticaria   Neurological:     Mental Status: She is alert.  Psychiatric:        Mood and Affect: Mood normal.      UC Treatments / Results  Labs (all labs ordered are listed, but only abnormal results are displayed) Labs Reviewed - No data to display  EKG   Radiology No results found.  Procedures Procedures (including critical care time)  Medications Ordered in UC Medications - No data to display  Initial Impression / Assessment and Plan / UC Course  I have reviewed the triage vital signs and the nursing notes.  Pertinent labs & imaging results that were available during my care of the patient were reviewed by me and considered in my medical decision making (see chart for details).     Allergic reaction to unknown source. Patient has had reactions of this in the past.  Treating with prednisone taper Benadryl for itching as needed  Pedal edema Is likely from the  inflammatory reaction  and from traveling Recommended elevate feet and monitor.  Follow up as needed for continued or worsening symptoms   Final Clinical Impressions(s) / UC Diagnoses   Final diagnoses:  Allergic reaction, initial encounter  Pedal edema     Discharge Instructions     Take the prednisone taper as prescribed.  Benadryl for itching as needed Make sure you are elevating her feet to decrease swelling Follow up as needed for continued or worsening symptoms     ED Prescriptions    Medication Sig Dispense Auth. Provider   predniSONE (STERAPRED UNI-PAK 21 TAB) 10 MG (21) TBPK tablet 6 tabs for 1 day, then 5 tabs for 1 das, then 4 tabs for 1 day, then 3 tabs for 1 day, 2 tabs for 1 day, then 1 tab for 1 day 21 tablet Sabrina Keough A, NP     PDMP not reviewed this encounter.   Orvan July, NP 04/23/20 [phone]

## 2020-05-05 ENCOUNTER — Encounter: Payer: Self-pay | Admitting: Family Medicine

## 2020-10-28 ENCOUNTER — Other Ambulatory Visit: Payer: BC Managed Care – PPO

## 2020-10-28 DIAGNOSIS — Z20822 Contact with and (suspected) exposure to covid-19: Secondary | ICD-10-CM

## 2020-10-31 LAB — NOVEL CORONAVIRUS, NAA: SARS-CoV-2, NAA: NOT DETECTED

## 2020-11-09 HISTORY — PX: MULTIPLE TOOTH EXTRACTIONS: SHX2053

## 2021-04-03 ENCOUNTER — Other Ambulatory Visit: Payer: Self-pay | Admitting: Family Medicine

## 2021-04-07 NOTE — Telephone Encounter (Signed)
May pend this with 1 refill needs follow-up office visit on each

## 2022-02-02 ENCOUNTER — Ambulatory Visit: Payer: BC Managed Care – PPO | Admitting: Family Medicine

## 2022-02-02 VITALS — BP 130/84 | Ht 64.5 in | Wt 202.0 lb

## 2022-02-02 DIAGNOSIS — Z13220 Encounter for screening for lipoid disorders: Secondary | ICD-10-CM | POA: Diagnosis not present

## 2022-02-02 DIAGNOSIS — R1012 Left upper quadrant pain: Secondary | ICD-10-CM

## 2022-02-02 DIAGNOSIS — K649 Unspecified hemorrhoids: Secondary | ICD-10-CM | POA: Diagnosis not present

## 2022-02-02 DIAGNOSIS — K625 Hemorrhage of anus and rectum: Secondary | ICD-10-CM | POA: Diagnosis not present

## 2022-02-02 NOTE — Progress Notes (Signed)
? ?  Subjective:  ? ? Patient ID: Marie Singh, female    DOB: 1986-09-20, 36 y.o.   MRN: [id number] ? ?HPI ? ?Patient arrives with problems with hemorrhoids-has issues with IBS for years ? ?Patient also says she feels a full sensation under her left breast in the ribs under her breast- states it feels like not enough room in the area and pushes into breast she relates a fullness in the left upper abdomen.  She describes it as a ache at times.  Denies any injury.  States she feels that when she takes deep breaths she feels it every day its been going on for 6 months seem to be getting worse.  Denies breast pain. ? ?With the hemorrhoids she has had some times when it flared up enough to wear them may have bled bright red blood but she cannot know with 865% certainty that this is just from the hemorrhoid she states that sometimes when she wipes after using the bathroom ? ? ?Review of Systems ? ?   ?Objective:  ? Physical Exam ?Hemorrhoids are noted several external.  No internal masses are felt ?Abdomen is soft no guarding or rebound fullness and slight discomfort left upper quadrant ? ? ? ?   ?Assessment & Plan:  ?1. LUQ abdominal pain ?We will check lab work ?Order CT scan to rule out mass ?This been going on for 6 months getting progressively worse ?Please see above. ?- CBC with Differential/Platelet ?- Hepatic function panel ?- Basic metabolic panel ?- Ambulatory referral to Gastroenterology ?- CT ABDOMEN W CONTRAST ? ?2. Hemorrhoids, unspecified hemorrhoid type ?Referral to GI ? ? ?3. Blood per rectum ?More than likely needs colonoscopy check lab work as well ? ?- CBC with Differential/Platelet ?- Hepatic function panel ?- Basic metabolic panel ?- Ambulatory referral to Gastroenterology ? ?4. Screening, lipid ?Screening ?- Lipid panel ? ? ?

## 2022-02-03 ENCOUNTER — Encounter: Payer: Self-pay | Admitting: Physician Assistant

## 2022-02-03 NOTE — Progress Notes (Signed)
Dr. Tarri Glenn does not treat hemorrhoids. Message sent to referrals dept advising to schedule with first available provider whom treats hemorrhoids. ?

## 2022-02-05 ENCOUNTER — Telehealth: Payer: Self-pay | Admitting: Family Medicine

## 2022-02-05 NOTE — Telephone Encounter (Signed)
Message sent to referral coordinator

## 2022-02-05 NOTE — Telephone Encounter (Signed)
Nurses ?I connected with [facility] they have set her up for an appointment April 12-this should be viewable by the patient on MyChart ? ?It is very important that her CAT scan be completed before then.  Please pass that message on to [place].  Also patient does need to make sure she gets blood work done within the next week as well ? ?She does have a follow-up with me in about a month's time as well thanks ?

## 2022-02-06 LAB — CBC WITH DIFFERENTIAL/PLATELET
Basophils Absolute: 0 10*3/uL (ref 0.0–0.2)
Basos: 1 %
EOS (ABSOLUTE): 0.1 10*3/uL (ref 0.0–0.4)
Eos: 1 %
Hematocrit: 36.2 % (ref 34.0–46.6)
Hemoglobin: 12 g/dL (ref 11.1–15.9)
Immature Grans (Abs): 0 10*3/uL (ref 0.0–0.1)
Immature Granulocytes: 0 %
Lymphocytes Absolute: 2.3 10*3/uL (ref 0.7–3.1)
Lymphs: 38 %
MCH: 25.8 pg — ABNORMAL LOW (ref 26.6–33.0)
MCHC: 33.1 g/dL (ref 31.5–35.7)
MCV: 78 fL — ABNORMAL LOW (ref 79–97)
Monocytes Absolute: 0.4 10*3/uL (ref 0.1–0.9)
Monocytes: 6 %
Neutrophils Absolute: 3.3 10*3/uL (ref 1.4–7.0)
Neutrophils: 54 %
Platelets: 305 10*3/uL (ref 150–450)
RBC: 4.65 x10E6/uL (ref 3.77–5.28)
RDW: 12.9 % (ref 11.7–15.4)
WBC: 6.1 10*3/uL (ref 3.4–10.8)

## 2022-02-06 LAB — BASIC METABOLIC PANEL
BUN/Creatinine Ratio: 11 (ref 9–23)
BUN: 8 mg/dL (ref 6–20)
CO2: 25 mmol/L (ref 20–29)
Calcium: 8.8 mg/dL (ref 8.7–10.2)
Chloride: 102 mmol/L (ref 96–106)
Creatinine, Ser: 0.73 mg/dL (ref 0.57–1.00)
Glucose: 93 mg/dL (ref 70–99)
Potassium: 3.8 mmol/L (ref 3.5–5.2)
Sodium: 138 mmol/L (ref 134–144)
eGFR: 109 mL/min/{1.73_m2} (ref >59)

## 2022-02-06 LAB — LIPID PANEL
Chol/HDL Ratio: 3.1 ratio (ref 0.0–4.4)
Cholesterol, Total: 154 mg/dL (ref 100–199)
HDL: 50 mg/dL (ref >39)
LDL Chol Calc (NIH): 94 mg/dL (ref 0–99)
Triglycerides: 49 mg/dL (ref 0–149)
VLDL Cholesterol Cal: 10 mg/dL (ref 5–40)

## 2022-02-06 LAB — HEPATIC FUNCTION PANEL
ALT: 9 IU/L (ref 0–32)
AST: 15 IU/L (ref 0–40)
Albumin: 4.1 g/dL (ref 3.8–4.8)
Alkaline Phosphatase: 87 IU/L (ref 44–121)
Bilirubin Total: 0.3 mg/dL (ref 0.0–1.2)
Bilirubin, Direct: <0.1 mg/dL (ref 0.00–0.40)
Total Protein: 6.9 g/dL (ref 6.0–8.5)

## 2022-02-11 NOTE — Telephone Encounter (Signed)
Patient informed of message and md recommendations. Verbalized understanding. ?

## 2022-02-11 NOTE — Telephone Encounter (Signed)
Left message to return call 

## 2022-02-11 NOTE — Telephone Encounter (Signed)
Inform patient that her CT scan was denied ?I believe the next best step is for her to see gastroenterology as planned they will help determine if she needs a CT scan as well as the colonoscopy. ?Please make sure patient knows to follow through with gastroenterology.  She also has a standard follow-up with [place] later in the month thank you ?

## 2022-02-17 NOTE — Progress Notes (Signed)
? ? ? ?02/18/2022 ?Marie Singh ?[id number] ?28-Nov-1985 ? ?Primary GI: Dr. Lorenso Courier ? ?ASSESSMENT AND PLAN:  ? ?Rectal bleeding/rectal lesion ?Has external finger flesh colored slightly ulcerated projection ?Unknown if irritated hemorrhoidal skin tag versus possible condylomata will refer to gen surgery for further evaluation.  ?Will schedule for colonoscopy with history of diarrhea and rectal bleeding, if negative and has internal hemorrhoids can offer banding ?We have discussed the risks of bleeding, infection, perforation, medication reactions, and remote risk of death associated with colonoscopy. All questions were answered and the patient acknowledges these risk and wishes to proceed. ? ?Left upper quadrant abdominal pain ?-     CT ABDOMEN PELVIS W CONTRAST; Future ?-     CBC with Differential/Platelet; Future ?-     Comprehensive metabolic panel; Future ?Normal CBC without thrombocytopenia, no splenomegaly on exam.  ?No chest wall pain on palpation ?Worse during menses/deep breath, no PE symptoms.  ?Will proceed with CT scan due to Ab pain and history of loose stools to evaluate for colitis, if negative may want to consider endometriosis with menstrual history  ? ?Diarrhea, unspecified type ?With symptoms of nocturnal diarrhea and chronicity some concern for IBD, will get inflammatory labs and schedule for colonoscopy with CT AB and pelvis.  If negative possible IBS-D and can discuss at next OV.  ?Check celiac panel, ?FODMAP and lifestyle changes discussed.  ?-     TSH; Future ?-     IgA; Future ?-     Tissue transglutaminase, IgA; Future ?-     C-reactive protein; Future ?-     Sedimentation rate; Future ?-     CT ABDOMEN PELVIS W CONTRAST; Future ?-     CBC with Differential/Platelet; Future ?-     Comprehensive metabolic panel; Future ? ? ?Patient Care Team: ?[name], MD as PCP - General (Family Medicine) ? ?HISTORY OF PRESENT ILLNESS: ?36 y.o. female referred by [name], MD, with a past  medical history of PCOS, dysmenorrhea, IBS, migraines, asthma and others listed below presents for evaluation of left upper abdominal pain and rectal bleeding.  ?Patient's PCP,Dr. Wolfgang Phoenix, ordered CT AB however insurance denied this.  ? ?She states she has a history of IBS, diagnosed in 6th grade.  ?Has never seen GI doctor, never had colonoscopy, told by pediatric.  ?Will have BM 2 x a day can have up to 5 a day, majority are loose.  ?Has had nocturnal symptoms where she wakes up, more rare.  ?She has AB cramping lower AB with bowel movements, better with BM.  ?No unintentional weight loss, fever, chills.  ?Rare constipation, only if had surgery.  ?No family history of colon cancer, no autoimmune family history.  ? ?In her 20-30's she has always dealt with hemorrhoids.  ?PCP noted external hemorrhoids.  ?Began to have more rectal pain even with sitting.  ?She has had some BRB on TP and in the toilet, will have blood with wiping every time even with just urination, has to wear panty liners. Has only seen in the toilet a few time.   ? ?Patient has had 4-6 months of LUQ AB pain.  ?Always has discomfort LUQ pain with menses, but has constant fullness. Worse with deep breath, can be worse with eating.  ?No SOB, tachycardia, leg swelling, chest pain.  ?No anemia, did have microcytosis, no leukocytosis. Normal platelets.  ? ?CBC  02/05/2022  ?HGB 12.0 with out anemia MCV 78 with mild microcytosis ?WBC 6.1 Platelets 305 ?  Kidney function 02/05/2022  ?BUN 8 Cr 0.73  ?Potassium 3.8   ?LFTs 02/05/2022  ?AST 15 ALT 9 ?Alkphos 87 TBili 0.3 ? ?Current Medications:  ? ? ?Current Outpatient Medications (Cardiovascular):  ?  EPINEPHrine 0.3 mg/0.3 mL IJ SOAJ injection, Inject 0.3 mg into the muscle once. ? ?Current Outpatient Medications (Respiratory):  ?  albuterol (VENTOLIN HFA) 108 (90 Base) MCG/ACT inhaler, 2 PUFFS EVERY 4 HOURS AS NEEDED FOR COUGH OR WHEEZE. MAY USE 2 PUFFS 10-20 MINUTES PRIOR TO EXERCISE. ?  FLOVENT HFA 110  MCG/ACT inhaler, TAKE 2 PUFFS BY MOUTH TWICE A DAY ? ? ? ? ?Medical History:  ?Past Medical History:  ?Diagnosis Date  ? Allergy   ? H/O dysmenorrhea 2009  ? H/O varicella   ? IBS (irritable bowel syndrome)   ? Irregular periods/menstrual cycles 2009  ? Migraine   ? Frequent  ? PCOS (polycystic ovarian syndrome) 2009  ? Yeast infection   ? ?Allergies:  ?Allergies  ?Allergen Reactions  ? Amoxicillin Anaphylaxis and Hives  ?  Can take cephalosporins  ? Justicia Adhatoda (Malabar Nut Tree) [Justicia Adhatoda] Hives  ? Metronidazole   ?  urticaria  ?  ? ?Surgical History:  ?She  has a past surgical history that includes Tongue surgery; Root canal; Breast surgery (11/09/2010); and LEEP. ?Family History:  ?Her family history includes AAA (abdominal aortic aneurysm) in her mother; Allergic rhinitis in her mother and sister; Crohn's disease in her brother; Diabetes in her mother; Food Allergy in her mother; Heart attack in her father; Hypertension in her father. ?Social History:  ? reports that she has never smoked. She has never used smokeless tobacco. She reports current alcohol use. No history on file for drug use. ? ?REVIEW OF SYSTEMS  : All other systems reviewed and negative except where noted in the History of Present Illness. ? ? ?PHYSICAL EXAM: ?BP 116/70   Pulse 74   Ht '5\' 4"'$  (1.626 m)   Wt 205 lb (93 kg)   SpO2 98%   BMI 35.19 kg/m?  ?General:   Pleasant, well developed female in no acute distress ?Head:  Normocephalic and atraumatic. ?Eyes: sclerae anicteric,conjunctive pink  ?Heart:  regular rate and rhythm ?Pulm: Clear anteriorly; no wheezing ?Abdomen:   Soft, Obese AB, Active bowel sounds. mild tenderness in the periumbilical area, in the LUQ, and in the lower abdomen. Without guarding and Without rebound, No organomegaly appreciated, no splenomegaly.  ?Rectal: 2-3 cm flesh colored finger like projection slightly ulcerated posteriorly, normal rectal tone, appreciated internal hemorrhoids, tender, no  masses, no stool in the vault hemoccult Negative ?Extremities:  Without edema. ?Msk:  Symmetrical without gross deformities. Peripheral pulses intact.  ?Neurologic:  Alert and  oriented x4;  No focal deficits.  ?Skin:   Dry and intact without significant lesions or rashes. ?Psychiatric: Cooperative. Normal mood and affect. ? ? ? ?Vladimir Crofts, PA-C ?2:44 PM ? ? ?

## 2022-02-18 ENCOUNTER — Ambulatory Visit: Payer: BC Managed Care – PPO | Admitting: Physician Assistant

## 2022-02-18 ENCOUNTER — Encounter: Payer: Self-pay | Admitting: Physician Assistant

## 2022-02-18 VITALS — BP 116/70 | HR 74 | Ht 64.0 in | Wt 205.0 lb

## 2022-02-18 DIAGNOSIS — K625 Hemorrhage of anus and rectum: Secondary | ICD-10-CM | POA: Diagnosis not present

## 2022-02-18 DIAGNOSIS — K629 Disease of anus and rectum, unspecified: Secondary | ICD-10-CM

## 2022-02-18 DIAGNOSIS — R1012 Left upper quadrant pain: Secondary | ICD-10-CM

## 2022-02-18 DIAGNOSIS — R197 Diarrhea, unspecified: Secondary | ICD-10-CM | POA: Diagnosis not present

## 2022-02-18 NOTE — Progress Notes (Signed)
I agree with the assessment and plan as outlined by Ms. Collier. 

## 2022-02-18 NOTE — Patient Instructions (Signed)
Please do sitz baths, increase fiber or add benefiber, increase water and increase acitivity.  ? ?About Hemorrhoids ? ?Hemorrhoids are swollen veins in the lower rectum and anus.  Also called piles, hemorrhoids are a common problem.  Hemorrhoids may be internal (inside the rectum) or external (around the anus). ? ?Internal Hemorrhoids ? ?Internal hemorrhoids are often painless, but they rarely cause bleeding.  The internal veins may stretch and fall down (prolapse) through the anus to the outside of the body.  The veins may then become irritated and painful. ? ?External Hemorrhoids ? ?External hemorrhoids can be easily seen or felt around the anal opening.  They are under the skin around the anus.  When the swollen veins are scratched or broken by straining, rubbing or wiping they sometimes bleed. ? ?How Hemorrhoids Occur ? ?Veins in the rectum and around the anus tend to swell under pressure.  Hemorrhoids can result from increased pressure in the veins of your anus or rectum.  Some sources of pressure are: ? ?Straining to have a bowel movement because of constipation ?Waiting too long to have a bowel movement ?Coughing and sneezing often ?Sitting for extended periods of time, including on the toilet ?Diarrhea ?Obesity ?Trauma or injury to the anus ?Some liver diseases ?Stress ?Family history of hemorrhoids ?Pregnancy ? Pregnant women should try to avoid becoming constipated, because they are more likely to have hemorrhoids during pregnancy.  In the last trimester of pregnancy, the enlarged uterus may press on blood vessels and causes hemorrhoids.  In addition, the strain of childbirth sometimes causes hemorrhoids after the birth. ? ?Symptoms of Hemorrhoids ? ?Some symptoms of hemorrhoids include: ?Swelling and/or a tender lump around the anus ?Itching, mild burning and bleeding around the anus ?Painful bowel movements with or without constipation ?Bright red blood covering the stool, on toilet paper or in the  toilet bowel.  ? ?Symptoms usually go away within a few days.  Always talk to your doctor about any bleeding to make sure it is not from some other causes. ? ?Diagnosing and Treating Hemorrhoids ? ?Diagnosis is made by an examination by your healthcare provider.  Special test can be performed by your doctor.   ? ?Most cases of hemorrhoids can be treated with: ?High-fiber diet: Eat more high-fiber foods, which help prevent constipation.  Ask for more detailed fiber information on types and sources of fiber from your healthcare provider. ?Fluids: Drink plenty of water.  This helps soften bowel movements so they are easier to pass. ?Sitz baths and cold packs: Sitting in lukewarm water two or three times a day for 15 minutes cleases the anal area and may relieve discomfort.  If the water is too hot, swelling around the anus will get worse.  Placing a cloth-covered ice pack on the anus for ten minutes four times a day can also help reduce selling.  Gently pushing a prolapsed hemorrhoid back inside after the bath or ice pack can be helpful. ?Medications: For mild discomfort, your healthcare provider may suggest over-the-counter pain medication or prescribe a cream or ointment for topical use.  The cream may contain witch hazel, zinc oxide or petroleum jelly.  Medicated suppositories are also a treatment option.  Always consult your doctor before applying medications or creams. ?Procedures and surgeries: There are also a number of procedures and surgeries to shrink or remove hemorrhoids in more serious cases.  Talk to your physician about these options. ? ?You can often prevent hemorrhoids or keep them from becoming worse  by maintaining a healthy lifestyle.  Eat a fiber-rich diet of fruits, vegetables and whole grains.  Also, drink plenty of water and exercise regularly. ? ?? 2007, [employer] Doc.30 ? ? ?If diarrhea is severe - 4+ watery diarrhea episodes, take imodium to reduce fluid loss, and start on  electrolyte supplemented fluids.  ? ?Please go to the ER if you have any severe AB pain, unable to hold down food/water, blood in stool or vomit, chest pain, shortness of breath, or any worsening symptoms.  ?  ?Consider keeping a food diary- common causes of diarrhea are dairy, certain carbs... ?  ?FODMAP stands for fermentable oligo-, di-, mono-saccharides and polyols (1). ?These are the scientific terms used to classify groups of carbs that are notorious for triggering digestive symptoms like bloating, gas and stomach pain.  ? ?FODMAPs are found in a wide range of foods in varying amounts. Some foods contain just one type, while others contain several. ? ?The main dietary sources of the four groups of FODMAPs include:  ?Oligosaccharides: Wheat, rye, legumes and various fruits and vegetables, such as garlic and onions. ? ?Disaccharides: Milk, yogurt and soft cheese. Lactose is the main carb. ? ?Monosaccharides: Various fruit including figs and mangoes, and sweeteners such as honey and agave nectar. Fructose is the main carb. ? ?Polyols: Certain fruits and vegetables including blackberries and lychee, as well as some low-calorie sweeteners like those in sugar-free gum. ?  ?Keep a food diary. This will help you identify foods that cause symptoms. Write down: ?What you eat and when. ?What symptoms you have. ?When symptoms occur in relation to your meals. ?Avoid foods that cause symptoms. Talk with your dietitian about other ways to get the same nutrients that are in these foods. ?Eat your meals slowly, in a relaxed setting. ?Aim to eat 5-6 small meals per day. Do not skip meals. ?Drink enough fluids to keep your urine clear or pale yellow. ?If dairy products cause your symptoms to flare up, try eating less of them. You might be able to handle yogurt better than other dairy products because it contains bacteria that help with digestion. ?  ? ? ? ? ?You have been scheduled for a colonoscopy. Please follow written  instructions given to you at your visit today.  ?Please pick up your prep supplies at the pharmacy within the next 1-3 days. ?If you use inhalers (even only as needed), please bring them with you on the day of your procedure.  ? ?Due to recent changes in healthcare laws, you may see the results of your imaging and laboratory studies on MyChart before your provider has had a chance to review them.  We understand that in some cases there may be results that are confusing or concerning to you. Not all laboratory results come back in the same time frame and the provider may be waiting for multiple results in order to interpret others.  Please give [place] 48 hours in order for your provider to thoroughly review all the results before contacting the office for clarification of your results.   ? ?Your provider has requested that you go to the basement level for lab work before leaving today. Press "B" on the elevator. The lab is located at the first door on the left as you exit the elevator.  ? ?If you are age 14 or older, your body mass index should be between 23-30. Your Body mass index is 35.19 kg/m?Marland Kitchen If this is out of the aforementioned range listed,  please consider follow up with your Primary Care Provider. ? ?If you are age 42 or younger, your body mass index should be between 19-25. Your Body mass index is 35.19 kg/m?Marland Kitchen If this is out of the aformentioned range listed, please consider follow up with your Primary Care Provider.  ? ?________________________________________________________ ? ?The [facility] providers would like to encourage you to use [facility] to communicate with providers for non-urgent requests or questions.  Due to long hold times on the telephone, sending your provider a message by [facility] may be a faster and more efficient way to get a response.  Please allow 48 business hours for a response.  Please remember that this is for non-urgent requests.  ?_______________________________________________________   ? ? ? ?You have been scheduled for a CT scan of the abdomen and pelvis at [facility], 1st floor Radiology. You are scheduled on 02/27/2022  at 8:30an. You should arrive 15 minutes prior to your appointment

## 2022-02-23 ENCOUNTER — Ambulatory Visit: Payer: BC Managed Care – PPO | Admitting: Family Medicine

## 2022-02-25 ENCOUNTER — Encounter: Payer: Self-pay | Admitting: Internal Medicine

## 2022-02-27 ENCOUNTER — Telehealth: Payer: Self-pay | Admitting: *Deleted

## 2022-02-27 ENCOUNTER — Other Ambulatory Visit: Payer: Self-pay | Admitting: *Deleted

## 2022-02-27 ENCOUNTER — Ambulatory Visit (HOSPITAL_COMMUNITY)
Admission: RE | Admit: 2022-02-27 | Discharge: 2022-02-27 | Disposition: A | Discharge status: Home / Self Care | Payer: BC Managed Care – PPO | Source: Ambulatory Visit | Attending: Physician Assistant | Admitting: Physician Assistant

## 2022-02-27 DIAGNOSIS — R1012 Left upper quadrant pain: Secondary | ICD-10-CM | POA: Insufficient documentation

## 2022-02-27 DIAGNOSIS — R197 Diarrhea, unspecified: Secondary | ICD-10-CM | POA: Diagnosis present

## 2022-02-27 DIAGNOSIS — K629 Disease of anus and rectum, unspecified: Secondary | ICD-10-CM

## 2022-02-27 MED ORDER — IOHEXOL 300 MG/ML  SOLN
100.0000 mL | Freq: Once | INTRAMUSCULAR | Status: AC | PRN
  Administered 2022-02-27: 100 mL via INTRAVENOUS

## 2022-02-27 NOTE — Telephone Encounter (Signed)
Printed notes and demographics to send to [facility] today. But pt just had CT abdomen Pelvis today. No results in yet ?

## 2022-03-04 ENCOUNTER — Encounter: Payer: Self-pay | Admitting: Internal Medicine

## 2022-03-04 ENCOUNTER — Ambulatory Visit (AMBULATORY_SURGERY_CENTER): Payer: BC Managed Care – PPO | Admitting: Internal Medicine

## 2022-03-04 VITALS — BP 129/82 | HR 94 | Temp 97.3°F | Resp 14 | Ht 64.0 in | Wt 205.0 lb

## 2022-03-04 DIAGNOSIS — K625 Hemorrhage of anus and rectum: Secondary | ICD-10-CM

## 2022-03-04 DIAGNOSIS — K5140 Inflammatory polyps of colon without complications: Secondary | ICD-10-CM | POA: Diagnosis not present

## 2022-03-04 DIAGNOSIS — K633 Ulcer of intestine: Secondary | ICD-10-CM

## 2022-03-04 DIAGNOSIS — Z1211 Encounter for screening for malignant neoplasm of colon: Secondary | ICD-10-CM

## 2022-03-04 DIAGNOSIS — K37 Unspecified appendicitis: Secondary | ICD-10-CM | POA: Diagnosis not present

## 2022-03-04 DIAGNOSIS — R197 Diarrhea, unspecified: Secondary | ICD-10-CM

## 2022-03-04 DIAGNOSIS — D121 Benign neoplasm of appendix: Secondary | ICD-10-CM

## 2022-03-04 HISTORY — PX: COLONOSCOPY W/ BIOPSIES: SHX1374

## 2022-03-04 MED ORDER — SODIUM CHLORIDE 0.9 % IV SOLN: 500.0000 mL | Freq: Once | INTRAVENOUS | Status: DC

## 2022-03-04 NOTE — Progress Notes (Signed)
To Pacu [facility]SS. Report to [email or web address] ?

## 2022-03-04 NOTE — Op Note (Signed)
[place] ?Patient Name: Marie Singh ?Procedure Date: 03/04/2022 2:29 PM ?MRN: [id number] ?Endoscopist: Sonny Masters "Christia Reading ,  ?Age: 36 ?Referring MD:  ?Date of Birth: Apr 28, 1986 ?Gender: Female ?Account #: [id number] ?Procedure:                Colonoscopy ?Indications:              Screening for colorectal malignant neoplasm, This  ?                          is the patient's first colonoscopy ?Medicines:                Monitored Anesthesia Care ?Procedure:                Pre-Anesthesia Assessment: ?                          - Prior to the procedure, a History and Physical  ?                          was performed, and patient medications and  ?                          allergies were reviewed. The patient's tolerance of  ?                          previous anesthesia was also reviewed. The risks  ?                          and benefits of the procedure and the sedation  ?                          options and risks were discussed with the patient.  ?                          All questions were answered, and informed consent  ?                          was obtained. Prior Anticoagulants: The patient has  ?                          taken no previous anticoagulant or antiplatelet  ?                          agents. ASA Grade Assessment: II - A patient with  ?                          mild systemic disease. After reviewing the risks  ?                          and benefits, the patient was deemed in  ?                          satisfactory condition to undergo the procedure. ?  After obtaining informed consent, the colonoscope  ?                          was passed under direct vision. Throughout the  ?                          procedure, the patient's blood pressure, pulse, and  ?                          oxygen saturations were monitored continuously. The  ?                          Colonoscope was introduced through the anus and  ?                          advanced to the the  terminal ileum. The colonoscopy  ?                          was performed without difficulty. The patient  ?                          tolerated the procedure well. The quality of the  ?                          bowel preparation was good. The terminal ileum,  ?                          ileocecal valve, appendiceal orifice, and rectum  ?                          were photographed. ?Scope In: 2:35:12 PM ?Scope Out: 2:56:28 PM ?Scope Withdrawal Time: 0 hours 18 minutes 59 seconds  ?Total Procedure Duration: 0 hours 21 minutes 16 seconds  ?Findings:                 The terminal ileum appeared normal. ?                          A 5 mm polyp was found in the appendiceal orifice.  ?                          The polyp was sessile. The polyp was removed with a  ?                          piecemeal technique using a cold snare. Resection  ?                          and retrieval were complete. ?                          A few ulcers were found in the ascending colon. No  ?                          bleeding was present. No stigmata of recent  ?  bleeding were seen. Biopsies were taken with a cold  ?                          forceps for histology. ?                          Non-bleeding internal hemorrhoids were found during  ?                          retroflexion. ?Complications:            No immediate complications. ?Estimated Blood Loss:     Estimated blood loss was minimal. ?Impression:               - The examined portion of the ileum was normal. ?                          - One 5 mm polyp at the appendiceal orifice,  ?                          removed piecemeal using a cold snare. Resected and  ?                          retrieved. ?                          - A few ulcers in the ascending colon. Biopsied. ?                          - Non-bleeding internal hemorrhoids. ?Recommendation:           - Discharge patient to home (with escort). ?                          - Await pathology results. ?                           - Repeat colonoscopy in 6 months to review the  ?                          polypectomy site due to the polyp's location in the  ?                          appendiceal orifice. ?                          - Return to GI clinic in 1 month. ?                          - The findings and recommendations were discussed  ?                          with the patient. ?Georgian Co,  ?03/04/2022 3:02:04 PM ?

## 2022-03-04 NOTE — Progress Notes (Signed)
? ?GASTROENTEROLOGY PROCEDURE H&P NOTE  ? ?Primary Care Physician: ?[name], MD ? ? ? ?Reason for Procedure:   Rectal bleeding ? ?Plan:    Colonoscopy ? ?Patient is appropriate for endoscopic procedure(s) in the ambulatory ([facility]) setting. ? ?The nature of the procedure, as well as the risks, benefits, and alternatives were carefully and thoroughly reviewed with the patient. Ample time for discussion and questions allowed. The patient understood, was satisfied, and agreed to proceed.  ? ? ? ?HPI: ?Marie Singh is a 36 y.o. female who presents for colonoscopy for evaluation of rectal bleeding .  Patient was most recently seen in the Gastroenterology Clinic on 02/18/22.  No interval change in medical history since that appointment. Please refer to that note for full details regarding GI history and clinical presentation.  ? ?Past Medical History:  ?Diagnosis Date  ? Allergy   ? H/O dysmenorrhea 2009  ? H/O varicella   ? IBS (irritable bowel syndrome)   ? Irregular periods/menstrual cycles 2009  ? Migraine   ? Frequent  ? PCOS (polycystic ovarian syndrome) 2009  ? Yeast infection   ? ? ?Past Surgical History:  ?Procedure Laterality Date  ? BREAST SURGERY  11/09/2010  ? Breast  Reduction  ? LEEP    ? ROOT CANAL    ? TONGUE SURGERY    ? ? ?Prior to Admission medications   ?Medication Sig Start Date End Date Taking? Authorizing Provider  ?albuterol (VENTOLIN HFA) 108 (90 Base) MCG/ACT inhaler 2 PUFFS EVERY 4 HOURS AS NEEDED FOR COUGH OR WHEEZE. MAY USE 2 PUFFS 10-20 MINUTES PRIOR TO EXERCISE. 04/08/21   [name], MD  ?EPINEPHrine 0.3 mg/0.3 mL IJ SOAJ injection Inject 0.3 mg into the muscle once.    [provider]  ?FLOVENT HFA 110 MCG/ACT inhaler TAKE 2 PUFFS BY MOUTH TWICE A DAY 04/08/21   [name], MD  ? ? ?Current Outpatient Medications  ?Medication Sig Dispense Refill  ? albuterol (VENTOLIN HFA) 108 (90 Base) MCG/ACT inhaler 2 PUFFS EVERY 4 HOURS AS NEEDED FOR COUGH OR WHEEZE. MAY USE  2 PUFFS 10-20 MINUTES PRIOR TO EXERCISE. 18 each 1  ? EPINEPHrine 0.3 mg/0.3 mL IJ SOAJ injection Inject 0.3 mg into the muscle once.    ? FLOVENT HFA 110 MCG/ACT inhaler TAKE 2 PUFFS BY MOUTH TWICE A DAY 12 each 1  ? ?No current facility-administered medications for this visit.  ? ? ?Allergies as of 03/04/2022 - Review Complete 02/18/2022  ?Allergen Reaction Noted  ? Amoxicillin Anaphylaxis and Hives 03/14/2008  ? Justicia adhatoda (malabar nut tree) [justicia adhatoda] Hives 04/21/2020  ? Metronidazole  04/23/2020  ? ? ?Family History  ?Problem Relation Age of Onset  ? Diabetes Mother   ? Allergic rhinitis Mother   ? AAA (abdominal aortic aneurysm) Mother   ? Food Allergy Mother   ? Heart attack Father   ? Hypertension Father   ? Allergic rhinitis Sister   ? Crohn's disease Brother   ? Angioedema Neg Hx   ? Asthma Neg Hx   ? Eczema Neg Hx   ? Immunodeficiency Neg Hx   ? Urticaria Neg Hx   ? ? ?Social History  ? ?Socioeconomic History  ? Marital status: Single  ?  Spouse name: Not on file  ? Number of children: Not on file  ? Years of education: Not on file  ? Highest education level: Not on file  ?Occupational History  ? Occupation: [employer]  ?  Tobacco Use  ? Smoking status: Never  ? Smokeless tobacco: Never  ?Vaping Use  ? Vaping Use: Never used  ?Substance and Sexual Activity  ? Alcohol use: Yes  ? Drug use: Not on file  ? Sexual activity: Not Currently  ?  Birth control/protection: Pill  ?Other Topics Concern  ? Not on file  ?Social History Narrative  ? Not on file  ? ?Social Determinants of Health  ? ?Financial Resource Strain: Not on file  ?Food Insecurity: Not on file  ?Transportation Needs: Not on file  ?Physical Activity: Not on file  ?Stress: Not on file  ?Social Connections: Not on file  ?Intimate Partner Violence: Not on file  ? ? ?Physical Exam: ?Vital signs in last 24 hours: ?There were no vitals taken for this visit. ?GEN: NAD ?EYE: Sclerae anicteric ?ENT: MMM ?CV: Non-tachycardic ?Pulm:  No increased WOB ?GI: Soft ?NEURO:  Alert & Oriented ? ? ?Christia Reading, MD ?[facility] ? ? ?03/04/2022 1:34 PM ? ?

## 2022-03-04 NOTE — Progress Notes (Signed)
Pt's states no medical or surgical changes since previsit or office visit. 

## 2022-03-04 NOTE — Patient Instructions (Signed)
Handout given on polyps.  Await pathology results.  Repeat colonoscopy in 6 months. ? ?YOU HAD AN ENDOSCOPIC PROCEDURE TODAY AT [facility]:   Refer to the procedure report that was given to you for any specific questions about what was found during the examination.  If the procedure report does not answer your questions, please call your gastroenterologist to clarify.  If you requested that your care partner not be given the details of your procedure findings, then the procedure report has been included in a sealed envelope for you to review at your convenience later. ? ?YOU SHOULD EXPECT: Some feelings of bloating in the abdomen. Passage of more gas than usual.  Walking can help get rid of the air that was put into your GI tract during the procedure and reduce the bloating. If you had a lower endoscopy (such as a colonoscopy or flexible sigmoidoscopy) you may notice spotting of blood in your stool or on the toilet paper. If you underwent a bowel prep for your procedure, you may not have a normal bowel movement for a few days. ? ?Please Note:  You might notice some irritation and congestion in your nose or some drainage.  This is from the oxygen used during your procedure.  There is no need for concern and it should clear up in a day or so. ? ?SYMPTOMS TO REPORT IMMEDIATELY: ? ?Following lower endoscopy (colonoscopy or flexible sigmoidoscopy): ? Excessive amounts of blood in the stool ? Significant tenderness or worsening of abdominal pains ? Swelling of the abdomen that is new, acute ? Fever of 100?F or higher ?Gastroenterologist can be reached at any hour by calling [phone]. ?Do not use MyChart messaging for urgent concerns.  ? ? ?DIET:  We do recommend a small meal at first, but then you may proceed to your regular diet.  Drink plenty of fluids but you should avoid alcoholic beverages for 24 hours. ? ?ACTIVITY:  You should plan to take it easy for the rest of today and you should NOT  DRIVE or use heavy machinery until tomorrow (because of the sedation medicines used during the test).   ? ?FOLLOW UP: ?Our staff will call the number listed on your records 48-72 hours following your procedure to check on you and address any questions or concerns that you may have regarding the information given to you following your procedure. If we do not reach you, we will leave a message.  We will attempt to reach you two times.  During this call, we will ask if you have developed any symptoms of COVID 19. If you develop any symptoms (ie: fever, flu-like symptoms, shortness of breath, cough etc.) before then, please call [phone].  If you test positive for Covid 19 in the 2 weeks post procedure, please call and report this information to [place].   ? ?If any biopsies were taken you will be contacted by phone or by letter within the next 1-3 weeks.  Please call us at [phone] if you have not heard about the biopsies in 3 weeks.  ? ? ?SIGNATURES/CONFIDENTIALITY: ?You and/or your care partner have signed paperwork which will be entered into your electronic medical record.  These signatures attest to the fact that that the information above on your After Visit Summary has been reviewed and is understood.  Full responsibility of the confidentiality of this discharge information lies with you and/or your care-partner.  ?

## 2022-03-04 NOTE — Progress Notes (Signed)
Called to room to assist during endoscopic procedure.  Patient ID and intended procedure confirmed with present staff. Received instructions for my participation in the procedure from the performing physician.  

## 2022-03-05 ENCOUNTER — Ambulatory Visit: Payer: BC Managed Care – PPO | Admitting: Nurse Practitioner

## 2022-03-06 ENCOUNTER — Telehealth: Payer: Self-pay | Admitting: *Deleted

## 2022-03-06 ENCOUNTER — Encounter: Payer: Self-pay | Admitting: Internal Medicine

## 2022-03-06 NOTE — Telephone Encounter (Signed)
Recall updated to reflect ?

## 2022-03-06 NOTE — Telephone Encounter (Signed)
No answer for post procedure follow up call . Unable to leave VM. 

## 2022-03-06 NOTE — Telephone Encounter (Signed)
?  Follow up Call- ? ? ?  03/04/2022  ?  2:13 PM  ?Call back number  ?Post procedure Call Back phone  # [phone]  ?Permission to leave phone message Yes  ?  ? ?Patient questions: ? ?Do you have a fever, pain , or abdominal swelling? No. ?Pain Score  0 * ? ?Have you tolerated food without any problems? Yes.   ? ?Have you been able to return to your normal activities? Yes.   ? ?Do you have any questions about your discharge instructions: ?Diet   No. ?Medications  No. ?Follow up visit  No. ? ?Do you have questions or concerns about your Care? Yes. Pt c/o of an allergic reaction after the bowel prep. States she was experiencing some itching to her lower back when she arrived to [facility] for her colonoscopy. States that is normally how her allergic reactions start. States the itching worsened and she developed hives/a rash from her knees up to her head. No difficultly breathing with this event. She did seek medical attention at Urgent care and they prescribed her prednisone. States she has had allergic reactions before when she takes in large amounts of something, like she did with the prep. Instructed pt that Dr. Lorenso Courier would be made aware. Pt has an OV scheduled in May. Instructed pt to discuss this event with Dr. Lorenso Courier so that they can plan accordingly for her next colonoscopy in 6 months.  ? ?Actions: ?* If pain score is 4 or above: ?No action needed, pain <4. ? ?Dr. Lorenso Courier made aware of pt's c/o of an allergic reaction to possibly the prep.  ? ? ?

## 2022-03-11 ENCOUNTER — Encounter: Payer: Self-pay | Admitting: Internal Medicine

## 2022-03-12 NOTE — Telephone Encounter (Signed)
Left message for [facility] to return my call to see if pt has been scheduled yet ?

## 2022-03-16 ENCOUNTER — Ambulatory Visit: Payer: Self-pay | Admitting: General Surgery

## 2022-03-16 NOTE — H&P (Signed)
?  ?REFERRING PHYSICIAN:  Vicie Mutters., PA ?  ?PROVIDER:  Monico Blitz, MD ?  ?MRN: [id number] ?DOB: 16-Dec-1985 ?DATE OF ENCOUNTER: 03/16/2022 ?  ?Subjective  ?  ?Chief Complaint: anal lesion ?  ?  ?  ?History of Present Illness: ?Marie Singh is a 36 y.o. female who is seen today as an office consultation at the request of Dr. Lorenso Courier and Vicie Mutters for evaluation of anal lesion ?Marland Kitchen  Patient presented to GI with concerns of rectal bleeding and a rectal lesion.  She was noted to have a slightly ulcerated projection from the rectum.  Patient subsequently underwent colonoscopy with Dr. Lorenso Courier.  Several polyps were removed.  No significant hemorrhoid disease was noted.  She is here today to evaluate the perirectal lesion.  She has noticed this being symptomatic for the last few months.  She complains of blood on the toilet paper in this area as well as drainage. ?  ?  ?  ?Review of Systems: ?A complete review of systems was obtained from the patient.  I have reviewed this information and discussed as appropriate with the patient.  See HPI as well for other ROS. ?  ?  ?Medical History: ?Past Medical History  ?    ?Past Medical History:  ?Diagnosis Date  ? Asthma, unspecified asthma severity, unspecified whether complicated, unspecified whether persistent    ?  ?  ?  ?There is no problem list on file for this patient. ?  ?  ?Past Surgical History  ?     ?Past Surgical History:  ?Procedure Laterality Date  ? CERVICAL BIOPSY  W/ LOOP ELECTRODE EXCISION      ? COLONOSCOPY      ? REDUCTION MAMMAPLASTY Bilateral    ?  ?  ?  ?Allergies  ?     ?Allergies  ?Allergen Reactions  ? Amoxicillin Anaphylaxis, Hives and Rash  ?    Can take cephalosporins ?Can take cephalosporins ?   ? Metronidazole Hives and Other (See Comments)  ?    urticaria ?urticaria ?   ?  ?  ?  ?      ?Current Outpatient Medications on File Prior to Visit  ?Medication Sig Dispense Refill  ? albuterol 90 mcg/actuation inhaler albuterol sulfate  HFA 90 mcg/actuation aerosol inhaler ? PLEASE SEE ATTACHED FOR DETAILED DIRECTIONS      ? fluticasone propionate (FLOVENT HFA) 110 mcg/actuation inhaler Flovent HFA 110 mcg/actuation aerosol inhaler ? INHALE 2 PUFFS BY MOUTH TWICE DAILY      ? EPINEPHrine (EPIPEN) 0.3 mg/0.3 mL auto-injector Inject into the muscle      ?  ?No current facility-administered medications on file prior to visit.  ?  ?  ?Family History  ?     ?Family History  ?Problem Relation Age of Onset  ? Diabetes Mother    ? Coronary Artery Disease (Blocked arteries around heart) Father    ?  ?  ?  ?Social History  ?  ?   ?Tobacco Use  ?Smoking Status Never  ?Smokeless Tobacco Never  ?  ?  ?Social History  ?Social History  ?  ?    ?Socioeconomic History  ? Marital status: Single  ?Tobacco Use  ? Smoking status: Never  ? Smokeless tobacco: Never  ?Vaping Use  ? Vaping Use: Never used  ?Substance and Sexual Activity  ? Alcohol use: Yes  ? Drug use: Never  ?  ?  ?  ?Objective:  ?  ?  ?   ?  Vitals:  ?  03/16/22 1540  ?BP: 120/84  ?Temp: 36.7 ?C (98.1 ?F)  ?Weight: 93.5 kg (206 lb 3.2 oz)  ?Height: 163.8 cm (5' 4.5")  ?  ?  ?Exam ?Gen: NAD ?Abd: soft ?Rectal: Anterior anal lesion noted on the skin tag, ? fistula ?  ?  ?Labs, Imaging and Diagnostic Testing: ?Colonoscopy report review ?  ?Assessment and Plan:  ?Diagnoses and all orders for this visit: ?  ?Anal lesion ?  ?  ?  ?36 year old female who presents to the office for evaluation of an anal lesion that she has noticed for several months.  I have recommended excision of anterior skin tag.  This will allow [place] to perform an excisional biopsy.  We will also evaluate for possible fistula during her exam under anesthesia and perform a fistulotomy if needed. ?  ?  ?Rosario Adie, MD ?Colon and Rectal Surgery ?[facility]  ?No follow-ups on file. ?  ?

## 2022-03-18 ENCOUNTER — Telehealth: Payer: Self-pay | Admitting: Internal Medicine

## 2022-03-18 NOTE — Telephone Encounter (Signed)
Patient called regarding her recent procedure.  She is trying to get information regarding how much was billed to insurance as she has another upcoming surgery and she is trying to find out how much she is going to have to pay.  She said she called billing but they are saying we have not filed the claim yet for her procedure.  Please call and advise.  Thank you. ?

## 2022-03-19 NOTE — Telephone Encounter (Signed)
Patient had appointment at [place] on 05/13/517 Seen Dr Leighton Ruff ?

## 2022-04-07 ENCOUNTER — Ambulatory Visit: Payer: BC Managed Care – PPO | Admitting: Internal Medicine

## 2022-04-07 ENCOUNTER — Encounter: Payer: Self-pay | Admitting: Internal Medicine

## 2022-04-07 ENCOUNTER — Other Ambulatory Visit (INDEPENDENT_AMBULATORY_CARE_PROVIDER_SITE_OTHER): Payer: BC Managed Care – PPO

## 2022-04-07 VITALS — BP 132/70 | HR 69 | Ht 64.0 in | Wt 213.0 lb

## 2022-04-07 DIAGNOSIS — R197 Diarrhea, unspecified: Secondary | ICD-10-CM

## 2022-04-07 DIAGNOSIS — R1012 Left upper quadrant pain: Secondary | ICD-10-CM

## 2022-04-07 DIAGNOSIS — R140 Abdominal distension (gaseous): Secondary | ICD-10-CM | POA: Diagnosis not present

## 2022-04-07 DIAGNOSIS — K589 Irritable bowel syndrome without diarrhea: Secondary | ICD-10-CM

## 2022-04-07 DIAGNOSIS — R6881 Early satiety: Secondary | ICD-10-CM | POA: Diagnosis not present

## 2022-04-07 LAB — SEDIMENTATION RATE: Sed Rate: 22 mm/hr — ABNORMAL HIGH (ref 0–20)

## 2022-04-07 LAB — CBC WITH DIFFERENTIAL/PLATELET
Basophils Absolute: 0 10*3/uL (ref 0.0–0.1)
Basophils Relative: 0.3 % (ref 0.0–3.0)
Eosinophils Absolute: 0.1 10*3/uL (ref 0.0–0.7)
Eosinophils Relative: 2.2 % (ref 0.0–5.0)
HCT: 36.4 % (ref 36.0–46.0)
Hemoglobin: 12.2 g/dL (ref 12.0–15.0)
Lymphocytes Relative: 31.8 % (ref 12.0–46.0)
Lymphs Abs: 2.1 10*3/uL (ref 0.7–4.0)
MCHC: 33.5 g/dL (ref 30.0–36.0)
MCV: 79.1 fl (ref 78.0–100.0)
Monocytes Absolute: 0.6 10*3/uL (ref 0.1–1.0)
Monocytes Relative: 9.6 % (ref 3.0–12.0)
Neutro Abs: 3.7 10*3/uL (ref 1.4–7.7)
Neutrophils Relative %: 56.1 % (ref 43.0–77.0)
Platelets: 297 10*3/uL (ref 150.0–400.0)
RBC: 4.6 Mil/uL (ref 3.87–5.11)
RDW: 14.3 % (ref 11.5–15.5)
WBC: 6.6 10*3/uL (ref 4.0–10.5)

## 2022-04-07 LAB — COMPREHENSIVE METABOLIC PANEL
ALT: 9 U/L (ref 0–35)
AST: 15 U/L (ref 0–37)
Albumin: 4.2 g/dL (ref 3.5–5.2)
Alkaline Phosphatase: 65 U/L (ref 39–117)
BUN: 11 mg/dL (ref 6–23)
CO2: 28 mEq/L (ref 19–32)
Calcium: 9.2 mg/dL (ref 8.4–10.5)
Chloride: 101 mEq/L (ref 96–112)
Creatinine, Ser: 0.73 mg/dL (ref 0.40–1.20)
GFR: 105.65 mL/min (ref >60.00)
Glucose, Bld: 77 mg/dL (ref 70–99)
Potassium: 3.8 mEq/L (ref 3.5–5.1)
Sodium: 136 mEq/L (ref 135–145)
Total Bilirubin: 0.3 mg/dL (ref 0.2–1.2)
Total Protein: 7.5 g/dL (ref 6.0–8.3)

## 2022-04-07 LAB — C-REACTIVE PROTEIN: CRP: <1 mg/dL (ref 0.5–20.0)

## 2022-04-07 LAB — TSH: TSH: 0.89 u[IU]/mL (ref 0.35–5.50)

## 2022-04-07 NOTE — Progress Notes (Signed)
Chief Complaint: Early satiety, bloating, colonic ulcers  HPI : 36 year old female with history of IBS, migraines, and PCOS present for follow up of early satiety, bloating, and colonic ulcers  Patient feels really full whenever she eats just a little bit of food. Endorses bloating. Denies chest burning or regurgitation. She occasionally has a foamy burp. Denies N&V. Denies any ab pain in her abdomen. She has a cramping feeling when she is about to have a BM in the lower abdomen. Has an discomfort in the epigastric area. Denies dysphagia or odynophagia. Half-brother has Crohn's disease. Mother was recently diagnosed with diverticulitis. Weight has been fluctuating. Because of her job, she only has time to eat fast food at times. She has at least 2-3 BMs per day.  Wt Readings from Last 3 Encounters:  04/07/22 213 lb (96.6 kg)  03/04/22 205 lb (93 kg)  02/18/22 205 lb (93 kg)   Past Medical History:  Diagnosis Date   Allergy    H/O dysmenorrhea 2009   H/O varicella    IBS (irritable bowel syndrome)    Irregular periods/menstrual cycles 2009   Migraine    Frequent   PCOS (polycystic ovarian syndrome) 2009   Yeast infection      Past Surgical History:  Procedure Laterality Date   BREAST SURGERY  11/09/2010   Breast  Reduction   COLONOSCOPY W/ BIOPSIES     LEEP     ROOT CANAL     TONGUE SURGERY     Family History  Problem Relation Age of Onset   Diabetes Mother    Allergic rhinitis Mother    AAA (abdominal aortic aneurysm) Mother    Food Allergy Mother    Heart attack Father    Hypertension Father    Allergic rhinitis Sister    Crohn's disease Brother    Angioedema Neg Hx    Asthma Neg Hx    Eczema Neg Hx    Immunodeficiency Neg Hx    Urticaria Neg Hx    Social History   Tobacco Use   Smoking status: Never   Smokeless tobacco: Never  Vaping Use   Vaping Use: Never used  Substance Use Topics   Alcohol use: Yes   Current Outpatient Medications  Medication Sig  Dispense Refill   albuterol (VENTOLIN HFA) 108 (90 Base) MCG/ACT inhaler 2 PUFFS EVERY 4 HOURS AS NEEDED FOR COUGH OR WHEEZE. MAY USE 2 PUFFS 10-20 MINUTES PRIOR TO EXERCISE. 18 each 1   EPINEPHrine 0.3 mg/0.3 mL IJ SOAJ injection Inject 0.3 mg into the muscle once.     FLOVENT HFA 110 MCG/ACT inhaler TAKE 2 PUFFS BY MOUTH TWICE A DAY 12 each 1   magnesium 30 MG tablet Take 200 mg by mouth 2 (two) times daily.     Prenatal Vit-Fe Fumarate-FA (PRENATAL VITAMIN) 27-0.8 MG TABS Prenatal Vitamin     No current facility-administered medications for this visit.   Allergies  Allergen Reactions   Amoxicillin Anaphylaxis and Hives    Can take cephalosporins   Justicia Adhatoda (Malabar Nut [employer]) [Justicia Adhatoda] Hives   Metronidazole     urticaria     Review of Systems: All systems reviewed and negative except where noted in HPI.   Physical Exam: BP 132/70   Pulse 69   Ht '5\' 4"'$  (1.626 m)   Wt 213 lb (96.6 kg)   BMI 36.56 kg/m  Constitutional: Pleasant,well-developed, female in no acute distress. HEENT: Normocephalic and atraumatic. Conjunctivae are normal. No  scleral icterus. Cardiovascular: Normal rate, regular rhythm.  Pulmonary/chest: Effort normal and breath sounds normal. No wheezing, rales or rhonchi. Abdominal: Soft, nondistended, nontender. Bowel sounds active throughout. There are no masses palpable. No hepatomegaly. Extremities: No edema Neurological: Alert and oriented to person place and time. Skin: Skin is warm and dry. No rashes noted. Psychiatric: Normal mood and affect. Behavior is normal.  CT A/P w/contrast 02/27/22: IMPRESSION: No acute findings in the abdomen or pelvis. Small amount of free fluid in the pelvis, likely physiologic.  Colonoscopy 03/04/22: - The examined portion of the ileum was normal. - One 5 mm polyp at the appendiceal orifice, removed piecemeal using a cold snare. Resected and retrieved. - A few ulcers in the ascending colon. Biopsied. -  Non-bleeding internal hemorrhoids. Path: 1. Surgical [P], colon, appendical orifice, polyp (1) - INFLAMMATORY POLYP (4 OF 4 FRAGMENTS) - NO HIGH-GRADE DYSPLASIA OR MALIGNANCY IDENTIFIED 2. Surgical [P], Random colon sites - colonic ulcers & diarrhea - BENIGN COLONIC MUCOSA - NO ACTIVE INFLAMMATION OR EVIDENCE OF MICROSCOPIC COLITIS - NO HIGH-GRADE DYSPLASIA OR MALIGNANCY IDENTIFIED  ASSESSMENT AND PLAN: Early Satiety Bloating IBS Colonic ulcers Patient presents with early satiety and bloating.  Her description of her abdominal discomfort sounds most consistent with IBS to me.  However on her last colonoscopy she was noted to have some scattered ulcers as well as inflammatory polyps.  Thus I am currently planning to repeat her colonoscopy in 6 months to perform additional biopsies to determine if the patient has signs of inflammatory bowel disease.  Patient's bloating may be multifactorial so I recommended that she initially start off with a low FODMAP diet and checking some labs.  She is relatively medication averse and she was not interested in trial of PPI at this time. - Low FODMAP diet - Check CBC, CMP, TTG, TTG IgA, CRP, TSH - Colonsocopy in 08/2022 - RTC 2 months. Consider SIBO test, GES, or trial of PPI in the future  Christia Reading, MD  I spent 41 minutes of time, including in depth chart review, independent review of results as outlined above, communicating results with the patient directly, face-to-face time with the patient, coordinating care, ordering studies and medications as appropriate, and documentation.

## 2022-04-07 NOTE — Patient Instructions (Signed)
Your provider has requested that you go to the basement level for lab work before leaving today. Press "B" on the elevator. The lab is located at the first door on the left as you exit the elevator.  Due to recent changes in healthcare laws, you may see the results of your imaging and laboratory studies on MyChart before your provider has had a chance to review them.  We understand that in some cases there may be results that are confusing or concerning to you. Not all laboratory results come back in the same time frame and the provider may be waiting for multiple results in order to interpret others.  Please give [place] 48 hours in order for your provider to thoroughly review all the results before contacting the office for clarification of your results.   We are providing you with a low-fodmap diet to follow.   I appreciate the opportunity to care for you. Christia Reading, MD

## 2022-04-08 LAB — TISSUE TRANSGLUTAMINASE, IGA: (tTG) Ab, IgA: <1 U/mL

## 2022-04-08 LAB — IGA: Immunoglobulin A: 245 mg/dL (ref 47–310)

## 2022-05-29 ENCOUNTER — Encounter: Payer: Self-pay | Admitting: Internal Medicine

## 2022-05-29 ENCOUNTER — Ambulatory Visit: Payer: BC Managed Care – PPO | Admitting: Internal Medicine

## 2022-05-29 VITALS — BP 108/70 | HR 68 | Ht 64.0 in | Wt 205.8 lb

## 2022-05-29 DIAGNOSIS — R140 Abdominal distension (gaseous): Secondary | ICD-10-CM

## 2022-05-29 DIAGNOSIS — K589 Irritable bowel syndrome without diarrhea: Secondary | ICD-10-CM

## 2022-05-29 DIAGNOSIS — R6881 Early satiety: Secondary | ICD-10-CM

## 2022-05-29 DIAGNOSIS — K649 Unspecified hemorrhoids: Secondary | ICD-10-CM

## 2022-05-29 NOTE — Patient Instructions (Signed)
If you are age 37 or older, your body mass index should be between 23-30. Your Body mass index is 35.33 kg/m. If this is out of the aforementioned range listed, please consider follow up with your Primary Care Provider.  If you are age 61 or younger, your body mass index should be between 19-25. Your Body mass index is 35.33 kg/m. If this is out of the aformentioned range listed, please consider follow up with your Primary Care Provider.   ________________________________________________________  The [facility] providers would like to encourage you to use [facility] to communicate with providers for non-urgent requests or questions.  Due to long hold times on the telephone, sending your provider a message by [facility] may be a faster and more efficient way to get a response.  Please allow 48 business hours for a response.  Please remember that this is for non-urgent requests.  _______________________________________________________   We will contact you in November for a colonoscopy in December.  Thank you for entrusting me with your care and choosing [facility].  Dr. Lorenso Courier

## 2022-05-29 NOTE — Progress Notes (Signed)
Chief Complaint: Early satiety, bloating, colonic ulcers  HPI : 36 year old female with history of IBS, migraines, and PCOS present for follow up of early satiety, bloating, and colonic ulcers  Interval History: She has not had any more cramping since she started the low FODMAP diet. She is now re-introducing foods and does still notice some cramping. She feels some fullness in her upper abdomen. She does tend to feel more fullness after she eats and when she is on her menstrual period. Patient has been avoiding NSAIDs. She has been having one BM per day on average, decreased from prior. Patient has an upcoming skin tag removal with Dr. Marcello Moores scheduled in 06/2022.   Wt Readings from Last 3 Encounters:  05/29/22 205 lb 12.8 oz (93.4 kg)  04/07/22 213 lb (96.6 kg)  03/04/22 205 lb (93 kg)   Current Outpatient Medications  Medication Sig Dispense Refill   albuterol (VENTOLIN HFA) 108 (90 Base) MCG/ACT inhaler 2 PUFFS EVERY 4 HOURS AS NEEDED FOR COUGH OR WHEEZE. MAY USE 2 PUFFS 10-20 MINUTES PRIOR TO EXERCISE. 18 each 1   EPINEPHrine 0.3 mg/0.3 mL IJ SOAJ injection Inject 0.3 mg into the muscle once.     FLOVENT HFA 110 MCG/ACT inhaler TAKE 2 PUFFS BY MOUTH TWICE A DAY 12 each 1   magnesium 30 MG tablet Take 200 mg by mouth 2 (two) times daily.     Prenatal Vit-Fe Fumarate-FA (PRENATAL VITAMIN) 27-0.8 MG TABS Prenatal Vitamin     No current facility-administered medications for this visit.   Review of Systems: All systems reviewed and negative except where noted in HPI.   Physical Exam: BP 108/70   Pulse 68   Ht $R'5\' 4"'xS$  (1.626 m)   Wt 205 lb 12.8 oz (93.4 kg)   BMI 35.33 kg/m  Constitutional: Pleasant,well-developed, female in no acute distress. HEENT: Normocephalic and atraumatic. Conjunctivae are normal. No scleral icterus. Cardiovascular: Normal rate, regular rhythm.  Pulmonary/chest: Effort normal and breath sounds normal. No wheezing, rales or rhonchi. Abdominal: Soft,  nondistended, nontender. Bowel sounds active throughout. There are no masses palpable. No hepatomegaly. Extremities: No edema Neurological: Alert and oriented to person place and time. Skin: Skin is warm and dry. No rashes noted. Psychiatric: Normal mood and affect. Behavior is normal.  Labs 01/2022: CBC and CMP unremarkable  Labs 03/2022: CBC and CMP unremarkable. CRP nml. ESR is mildly elevated at 22. TTG IgA negative. IgA nml. TSH nml.   CT A/P w/contrast 02/27/22: IMPRESSION: No acute findings in the abdomen or pelvis. Small amount of free fluid in the pelvis, likely physiologic.  Colonoscopy 03/04/22: - The examined portion of the ileum was normal. - One 5 mm polyp at the appendiceal orifice, removed piecemeal using a cold snare. Resected and retrieved. - A few ulcers in the ascending colon. Biopsied. - Non-bleeding internal hemorrhoids. Path: 1. Surgical [P], colon, appendical orifice, polyp (1) - INFLAMMATORY POLYP (4 OF 4 FRAGMENTS) - NO HIGH-GRADE DYSPLASIA OR MALIGNANCY IDENTIFIED 2. Surgical [P], Random colon sites - colonic ulcers & diarrhea - BENIGN COLONIC MUCOSA - NO ACTIVE INFLAMMATION OR EVIDENCE OF MICROSCOPIC COLITIS - NO HIGH-GRADE DYSPLASIA OR MALIGNANCY IDENTIFIED  ASSESSMENT AND PLAN: Abdominal discomfort IBS History of colonic ulcers Patient has significant improvement in her abdominal discomfort by following a low FODMAP diet. She is currently trying to re-introduce foods to see what might be the culprit for her symptoms. I suspect that the decrease in the frequency of her BMs is also like to  the change in her diet. Her response to the dietary changes suggest that the patient's symptoms were likely related to IBS. Patient has discontinued her NSAID use so will plan to repeat her colonoscopy in 10/2022 to see if she has any signs of colonic ulcerations at that time.  - Low FODMAP diet - Will provide letter stating that she cannot sit for long periods of time in  preparation for her anal skin tag excision and exam under anesthesia with Dr. Marcello Moores - Recall for colonoscopy in 09/2022 in order to plan for colonoscopy in 10/2022 - RTC PRN  Christia Reading, MD  I spent 40 minutes of time, including in depth chart review, independent review of results as outlined above, communicating results with the patient directly, face-to-face time with the patient, coordinating care, ordering studies and medications as appropriate, and documentation.

## 2022-06-18 ENCOUNTER — Encounter (HOSPITAL_BASED_OUTPATIENT_CLINIC_OR_DEPARTMENT_OTHER): Payer: Self-pay | Admitting: General Surgery

## 2022-06-18 NOTE — Progress Notes (Signed)
Spoke w/ via phone for pre-op interview--- pt Lab needs dos----  urine preg             Lab results------ no COVID test -----patient states asymptomatic no test needed Arrive at ------- 0630 on 06-26-2022 NPO after MN NO Solid Food.  Clear liquids from MN until---  0530 Med rec completed Medications to take morning of surgery ----- none Diabetic medication ----- n/a Patient instructed no nail polish to be worn day of surgery Patient instructed to bring photo id and insurance card day of surgery Patient aware to have Driver (ride ) / caregiver for 24 hours after surgery -- mother, sherion Patient Special Instructions ----- asked to bring both inhalers dos Pre-Op special Istructions ----- n/a Patient verbalized understanding of instructions that were given at this phone interview. Patient denies shortness of breath, chest pain, fever, cough at this phone interview.

## 2022-06-19 ENCOUNTER — Other Ambulatory Visit: Payer: Self-pay

## 2022-06-25 ENCOUNTER — Ambulatory Visit: Payer: Self-pay | Admitting: General Surgery

## 2022-06-26 ENCOUNTER — Encounter (HOSPITAL_BASED_OUTPATIENT_CLINIC_OR_DEPARTMENT_OTHER): Payer: Self-pay | Admitting: General Surgery

## 2022-06-26 ENCOUNTER — Ambulatory Visit (HOSPITAL_BASED_OUTPATIENT_CLINIC_OR_DEPARTMENT_OTHER): Payer: BC Managed Care – PPO | Admitting: Anesthesiology

## 2022-06-26 ENCOUNTER — Encounter (HOSPITAL_BASED_OUTPATIENT_CLINIC_OR_DEPARTMENT_OTHER)
Admission: RE | Disposition: A | Discharge status: Home / Self Care | Payer: Self-pay | Source: Home / Self Care | Attending: General Surgery

## 2022-06-26 ENCOUNTER — Ambulatory Visit (HOSPITAL_BASED_OUTPATIENT_CLINIC_OR_DEPARTMENT_OTHER)
Admission: RE | Admit: 2022-06-26 | Discharge: 2022-06-26 | Disposition: A | Discharge status: Home / Self Care | Payer: BC Managed Care – PPO | Attending: General Surgery | Admitting: General Surgery

## 2022-06-26 ENCOUNTER — Other Ambulatory Visit: Payer: Self-pay

## 2022-06-26 DIAGNOSIS — E669 Obesity, unspecified: Secondary | ICD-10-CM | POA: Diagnosis not present

## 2022-06-26 DIAGNOSIS — Z6832 Body mass index (BMI) 32.0-32.9, adult: Secondary | ICD-10-CM | POA: Insufficient documentation

## 2022-06-26 DIAGNOSIS — J45909 Unspecified asthma, uncomplicated: Secondary | ICD-10-CM | POA: Insufficient documentation

## 2022-06-26 DIAGNOSIS — K603 Anal fistula: Secondary | ICD-10-CM | POA: Insufficient documentation

## 2022-06-26 DIAGNOSIS — E282 Polycystic ovarian syndrome: Secondary | ICD-10-CM | POA: Diagnosis not present

## 2022-06-26 DIAGNOSIS — K644 Residual hemorrhoidal skin tags: Secondary | ICD-10-CM | POA: Insufficient documentation

## 2022-06-26 DIAGNOSIS — Z01818 Encounter for other preprocedural examination: Secondary | ICD-10-CM

## 2022-06-26 DIAGNOSIS — K629 Disease of anus and rectum, unspecified: Secondary | ICD-10-CM | POA: Diagnosis present

## 2022-06-26 HISTORY — PX: EXCISION OF SKIN TAG: SHX6270

## 2022-06-26 HISTORY — DX: Personal history of other diseases of the digestive system: Z87.19

## 2022-06-26 HISTORY — DX: Other seasonal allergic rhinitis: J30.2

## 2022-06-26 HISTORY — DX: Personal history of cervical dysplasia: Z87.410

## 2022-06-26 HISTORY — DX: Unspecified asthma, uncomplicated: J45.909

## 2022-06-26 HISTORY — PX: RECTAL EXAM UNDER ANESTHESIA: SHX6399

## 2022-06-26 HISTORY — DX: Dysphonia: R49.0

## 2022-06-26 HISTORY — DX: Disease of anus and rectum, unspecified: K62.9

## 2022-06-26 HISTORY — DX: Presence of spectacles and contact lenses: Z97.3

## 2022-06-26 LAB — POCT PREGNANCY, URINE: Preg Test, Ur: NEGATIVE

## 2022-06-26 SURGERY — EXCISION, SKIN TAG
Anesthesia: Monitor Anesthesia Care | Site: Rectum

## 2022-06-26 MED ORDER — DEXAMETHASONE SODIUM PHOSPHATE 10 MG/ML IJ SOLN
INTRAMUSCULAR | Status: AC
  Filled 2022-06-26: qty 1

## 2022-06-26 MED ORDER — ONDANSETRON HCL 4 MG/2ML IJ SOLN
INTRAMUSCULAR | Status: DC | PRN
  Administered 2022-06-26: 4 mg via INTRAVENOUS

## 2022-06-26 MED ORDER — KETOROLAC TROMETHAMINE 30 MG/ML IJ SOLN
INTRAMUSCULAR | Status: DC | PRN
  Administered 2022-06-26: 30 mg via INTRAVENOUS

## 2022-06-26 MED ORDER — LACTATED RINGERS IV SOLN: INTRAVENOUS | Status: DC

## 2022-06-26 MED ORDER — KETOROLAC TROMETHAMINE 30 MG/ML IJ SOLN
INTRAMUSCULAR | Status: AC
  Filled 2022-06-26: qty 1

## 2022-06-26 MED ORDER — ACETAMINOPHEN 500 MG PO TABS
1000.0000 mg | ORAL_TABLET | ORAL | Status: AC
  Administered 2022-06-26: 1000 mg via ORAL

## 2022-06-26 MED ORDER — LIDOCAINE 2% (20 MG/ML) 5 ML SYRINGE
INTRAMUSCULAR | Status: DC | PRN
  Administered 2022-06-26: 30 mg via INTRAVENOUS

## 2022-06-26 MED ORDER — SODIUM CHLORIDE 0.9% FLUSH
3.0000 mL | Freq: Two times a day (BID) | INTRAVENOUS | Status: DC
Start: 2022-06-26 — End: 2022-06-26

## 2022-06-26 MED ORDER — ONDANSETRON HCL 4 MG/2ML IJ SOLN
INTRAMUSCULAR | Status: AC
  Filled 2022-06-26: qty 2

## 2022-06-26 MED ORDER — MIDAZOLAM HCL 2 MG/2ML IJ SOLN
INTRAMUSCULAR | Status: AC
  Filled 2022-06-26: qty 2

## 2022-06-26 MED ORDER — ACETAMINOPHEN 500 MG PO TABS
ORAL_TABLET | ORAL | Status: AC
  Filled 2022-06-26: qty 2

## 2022-06-26 MED ORDER — DEXAMETHASONE SODIUM PHOSPHATE 10 MG/ML IJ SOLN
INTRAMUSCULAR | Status: DC | PRN
  Administered 2022-06-26: 10 mg via INTRAVENOUS

## 2022-06-26 MED ORDER — FENTANYL CITRATE (PF) 100 MCG/2ML IJ SOLN
INTRAMUSCULAR | Status: AC
  Filled 2022-06-26: qty 2

## 2022-06-26 MED ORDER — BUPIVACAINE-EPINEPHRINE (PF) 0.5% -1:200000 IJ SOLN
INTRAMUSCULAR | Status: DC | PRN
  Administered 2022-06-26: 40 mL

## 2022-06-26 MED ORDER — MIDAZOLAM HCL 2 MG/2ML IJ SOLN
INTRAMUSCULAR | Status: DC | PRN
  Administered 2022-06-26: 2 mg via INTRAVENOUS

## 2022-06-26 MED ORDER — FENTANYL CITRATE (PF) 250 MCG/5ML IJ SOLN
INTRAMUSCULAR | Status: DC | PRN
  Administered 2022-06-26: 25 ug via INTRAVENOUS

## 2022-06-26 MED ORDER — 0.9 % SODIUM CHLORIDE (POUR BTL) OPTIME
TOPICAL | Status: DC | PRN
  Administered 2022-06-26: 1000 mL

## 2022-06-26 MED ORDER — PROPOFOL 500 MG/50ML IV EMUL
INTRAVENOUS | Status: DC | PRN
  Administered 2022-06-26: 100 ug/kg/min via INTRAVENOUS

## 2022-06-26 MED ORDER — TRAMADOL HCL 50 MG PO TABS: 50.0000 mg | ORAL_TABLET | Freq: Four times a day (QID) | ORAL | 0 refills | Status: DC | PRN

## 2022-06-26 MED ORDER — PROPOFOL 10 MG/ML IV BOLUS
INTRAVENOUS | Status: DC | PRN
  Administered 2022-06-26: 30 mg via INTRAVENOUS
  Administered 2022-06-26: 40 mg via INTRAVENOUS

## 2022-06-26 MED ORDER — PROPOFOL 10 MG/ML IV BOLUS
INTRAVENOUS | Status: AC
  Filled 2022-06-26: qty 20

## 2022-06-26 SURGICAL SUPPLY — 63 items
ADH SKN CLS APL DERMABOND .7 (GAUZE/BANDAGES/DRESSINGS) ×1
APL PRP STRL LF DISP 70% ISPRP (MISCELLANEOUS)
APL SKNCLS STERI-STRIP NONHPOA (GAUZE/BANDAGES/DRESSINGS) ×2
BENZOIN TINCTURE PRP APPL 2/3 (GAUZE/BANDAGES/DRESSINGS) ×2 IMPLANT
BLADE CLIPPER SENSICLIP SURGIC (BLADE) IMPLANT
BLADE EXTENDED COATED 6.5IN (ELECTRODE) IMPLANT
BLADE SURG 10 STRL SS (BLADE) IMPLANT
BLADE SURG 15 STRL LF DISP TIS (BLADE) IMPLANT
BLADE SURG 15 STRL SS (BLADE)
CHLORAPREP W/TINT 26 (MISCELLANEOUS) IMPLANT
COVER BACK TABLE 60X90IN (DRAPES) ×1 IMPLANT
COVER MAYO STAND STRL (DRAPES) ×1 IMPLANT
DECANTER SPIKE VIAL GLASS SM (MISCELLANEOUS) ×1 IMPLANT
DERMABOND ADVANCED (GAUZE/BANDAGES/DRESSINGS) ×1
DERMABOND ADVANCED .7 DNX12 (GAUZE/BANDAGES/DRESSINGS) ×1 IMPLANT
DRAPE HYSTEROSCOPY (MISCELLANEOUS) IMPLANT
DRAPE LAPAROTOMY 100X72 PEDS (DRAPES) ×1 IMPLANT
DRAPE SHEET LG 3/4 BI-LAMINATE (DRAPES) IMPLANT
DRAPE UTILITY XL STRL (DRAPES) ×1 IMPLANT
DRSG PAD ABDOMINAL 8X10 ST (GAUZE/BANDAGES/DRESSINGS) ×1 IMPLANT
DRSG TEGADERM 4X4.75 (GAUZE/BANDAGES/DRESSINGS) IMPLANT
ELECT REM PT RETURN 9FT ADLT (ELECTROSURGICAL) ×1
ELECTRODE REM PT RTRN 9FT ADLT (ELECTROSURGICAL) ×1 IMPLANT
GAUZE 4X4 16PLY ~~LOC~~+RFID DBL (SPONGE) ×1 IMPLANT
GAUZE SPONGE 4X4 12PLY STRL (GAUZE/BANDAGES/DRESSINGS) ×1 IMPLANT
GLOVE BIO SURGEON STRL SZ 6.5 (GLOVE) ×1 IMPLANT
GLOVE BIOGEL PI IND STRL 7.0 (GLOVE) ×1 IMPLANT
GLOVE BIOGEL PI INDICATOR 7.0 (GLOVE) ×1
GLOVE INDICATOR 6.5 STRL GRN (GLOVE) ×1 IMPLANT
GOWN STRL REUS W/TWL XL LVL3 (GOWN DISPOSABLE) ×1 IMPLANT
HYDROGEN PEROXIDE 16OZ (MISCELLANEOUS) ×1 IMPLANT
IV CATH 14GX2 1/4 (CATHETERS) ×1 IMPLANT
IV CATH 18G SAFETY (IV SOLUTION) ×1 IMPLANT
KIT SIGMOIDOSCOPE (SET/KITS/TRAYS/PACK) IMPLANT
KIT TURNOVER CYSTO (KITS) ×1 IMPLANT
LEGGING LITHOTOMY PAIR STRL (DRAPES) IMPLANT
LOOP VESSEL MAXI BLUE (MISCELLANEOUS) IMPLANT
NEEDLE HYPO 22GX1.5 SAFETY (NEEDLE) ×1 IMPLANT
NS IRRIG 500ML POUR BTL (IV SOLUTION) ×1 IMPLANT
PACK BASIN DAY SURGERY FS (CUSTOM PROCEDURE TRAY) ×1 IMPLANT
PAD ARMBOARD 7.5X6 YLW CONV (MISCELLANEOUS) IMPLANT
PANTS MESH DISP LRG (UNDERPADS AND DIAPERS) ×1 IMPLANT
PENCIL SMOKE EVACUATOR (MISCELLANEOUS) ×1 IMPLANT
SPONGE HEMORRHOID 8X3CM (HEMOSTASIS) IMPLANT
SPONGE SURGIFOAM ABS GEL 12-7 (HEMOSTASIS) IMPLANT
STRIP CLOSURE SKIN 1/2X4 (GAUZE/BANDAGES/DRESSINGS) IMPLANT
SUCTION FRAZIER HANDLE 10FR (MISCELLANEOUS)
SUCTION TUBE FRAZIER 10FR DISP (MISCELLANEOUS) IMPLANT
SUT CHROMIC 2 0 SH (SUTURE) IMPLANT
SUT CHROMIC 3 0 SH 27 (SUTURE) IMPLANT
SUT ETHIBOND 0 (SUTURE) IMPLANT
SUT VIC AB 2-0 SH 27 (SUTURE)
SUT VIC AB 2-0 SH 27XBRD (SUTURE) IMPLANT
SUT VIC AB 3-0 SH 18 (SUTURE) IMPLANT
SUT VIC AB 3-0 SH 27 (SUTURE)
SUT VIC AB 3-0 SH 27XBRD (SUTURE) IMPLANT
SYR CONTROL 10ML LL (SYRINGE) ×1 IMPLANT
TOWEL OR 17X26 10 PK STRL BLUE (TOWEL DISPOSABLE) ×1 IMPLANT
TRAY DSU PREP LF (CUSTOM PROCEDURE TRAY) ×1 IMPLANT
TUBE CONNECTING 12X1/4 (SUCTIONS) ×1 IMPLANT
UNDERPAD 30X36 HEAVY ABSORB (UNDERPADS AND DIAPERS) IMPLANT
WATER STERILE IRR 1000ML POUR (IV SOLUTION) ×1 IMPLANT
YANKAUER SUCT BULB TIP NO VENT (SUCTIONS) ×1 IMPLANT

## 2022-06-26 NOTE — Transfer of Care (Signed)
Immediate Anesthesia Transfer of Care Note  Patient: Marie Singh  Procedure(s) Performed: EXCISION OF ANAL SKIN TAG, POSSIBLE FISTULOTOMY (Rectum) RECTAL EXAM UNDER ANESTHESIA (Rectum)  Patient Location: PACU and Short Stay  Anesthesia Type:MAC  Level of Consciousness: awake, alert  and oriented  Airway & Oxygen Therapy: Patient Spontanous Breathing  Post-op Assessment: Report given to RN  Post vital signs: Reviewed and stable  Last Vitals:  Vitals Value Taken Time  BP 127/75   Temp    Pulse 99   Resp 100%   SpO2      Last Pain:  Vitals:   06/26/22 0711  TempSrc: Oral  PainSc: 0-No pain      Patients Stated Pain Goal: 6 (25/36/64 4034)  Complications: No notable events documented.

## 2022-06-26 NOTE — Anesthesia Preprocedure Evaluation (Addendum)
Anesthesia Evaluation  Patient identified by MRN, date of birth, ID band Patient awake    Reviewed: Allergy & Precautions, NPO status , Patient's Chart, lab work & pertinent test results  History of Anesthesia Complications Negative for: history of anesthetic complications  Airway Mallampati: II  TM Distance: >3 FB Neck ROM: Full    Dental  (+) Dental Advisory Given, Teeth Intact   Pulmonary asthma ,    Pulmonary exam normal        Cardiovascular negative cardio ROS Normal cardiovascular exam     Neuro/Psych  Headaches, negative psych ROS   GI/Hepatic Neg liver ROS,  IBS    Endo/Other   Obesity   Renal/GU negative Renal ROS     Musculoskeletal negative musculoskeletal ROS (+)   Abdominal   Peds  Hematology negative hematology ROS (+)   Anesthesia Other Findings Dysphonia  Reproductive/Obstetrics  PCOS                             Anesthesia Physical Anesthesia Plan  ASA: 2  Anesthesia Plan: MAC   Post-op Pain Management: Tylenol PO (pre-op)* and Minimal or no pain anticipated   Induction:   PONV Risk Score and Plan: 2 and Propofol infusion and Treatment may vary due to age or medical condition  Airway Management Planned: Natural Airway and Simple Face Mask  Additional Equipment: None  Intra-op Plan:   Post-operative Plan:   Informed Consent: I have reviewed the patients History and Physical, chart, labs and discussed the procedure including the risks, benefits and alternatives for the proposed anesthesia with the patient or authorized representative who has indicated his/her understanding and acceptance.       Plan Discussed with: CRNA and Anesthesiologist  Anesthesia Plan Comments:       Anesthesia Quick Evaluation

## 2022-06-26 NOTE — H&P (Signed)
REFERRING PHYSICIAN:  Vicie Mutters., PA   PROVIDER:  Monico Blitz, MD   MRN: [id number] DOB: 01/11/1986 DATE OF ENCOUNTER: 03/16/2022   Subjective    Chief Complaint: anal lesion       History of Present Illness: Marie Singh is a 36 y.o. female who is seen today as an office consultation at the request of Dr. Lorenso Courier and Vicie Mutters for evaluation of anal lesion .  Patient presented to GI with concerns of rectal bleeding and a rectal lesion.  She was noted to have a slightly ulcerated projection from the rectum.  Patient subsequently underwent colonoscopy with Dr. Lorenso Courier.  Several polyps were removed.  No significant hemorrhoid disease was noted.  She is here today to evaluate the perirectal lesion.  She has noticed this being symptomatic for the last few months.  She complains of blood on the toilet paper in this area as well as drainage.       Review of Systems: A complete review of systems was obtained from the patient.  I have reviewed this information and discussed as appropriate with the patient.  See HPI as well for other ROS.     Medical History: Past Medical History         Past Medical History:  Diagnosis Date   Asthma, unspecified asthma severity, unspecified whether complicated, unspecified whether persistent          There is no problem list on file for this patient.     Past Surgical History           Past Surgical History:  Procedure Laterality Date   CERVICAL BIOPSY  W/ LOOP ELECTRODE EXCISION       COLONOSCOPY       REDUCTION MAMMAPLASTY Bilateral          Allergies           Allergies  Allergen Reactions   Amoxicillin Anaphylaxis, Hives and Rash      Can take cephalosporins Can take cephalosporins     Metronidazole Hives and Other (See Comments)      urticaria urticaria                     Current Outpatient Medications on File Prior to Visit  Medication Sig Dispense Refill   albuterol 90 mcg/actuation inhaler  albuterol sulfate HFA 90 mcg/actuation aerosol inhaler  PLEASE SEE ATTACHED FOR DETAILED DIRECTIONS       fluticasone propionate (FLOVENT HFA) 110 mcg/actuation inhaler Flovent HFA 110 mcg/actuation aerosol inhaler  INHALE 2 PUFFS BY MOUTH TWICE DAILY       EPINEPHrine (EPIPEN) 0.3 mg/0.3 mL auto-injector Inject into the muscle        No current facility-administered medications on file prior to visit.      Family History           Family History  Problem Relation Age of Onset   Diabetes Mother     Coronary Artery Disease (Blocked arteries around heart) Father          Social History         Tobacco Use  Smoking Status Never  Smokeless Tobacco Never      Social History  Social History           Socioeconomic History   Marital status: Single  Tobacco Use   Smoking status: Never   Smokeless tobacco: Never  Vaping Use   Vaping Use: Never used  Substance and Sexual  Activity   Alcohol use: Yes   Drug use: Never        Objective:      Today's Vitals   06/18/22 1256 06/26/22 0711  BP:  127/72  Pulse:  73  Resp:  17  Temp:  98 F (36.7 C)  TempSrc:  Oral  SpO2:  98%  Weight: 93.4 kg 88.5 kg  Height: 5' 4.5" (1.638 m) 5' 4.5" (1.638 m)  PainSc:  0-No pain   Body mass index is 32.97 kg/m.  Exam Gen: NAD CV: RRR Lungs: CTA Abd: soft Rectal: Anterior anal lesion noted on the skin tag, ? fistula     Labs, Imaging and Diagnostic Testing: Colonoscopy report reviewed   Assessment and Plan:  Diagnoses and all orders for this visit:   Anal lesion       36 year old female who presents to the office for evaluation of an abnormal anal lesion that she has noticed for several months.  I have recommended excision of anterior skin tag.  This will allow [place] to perform an excisional biopsy.  We will also evaluate for possible fistula during her exam under anesthesia and perform a fistulotomy if needed.     Rosario Adie, MD Colon and Rectal Surgery [facility]  No follow-ups on file.

## 2022-06-26 NOTE — Op Note (Signed)
06/26/2022  10:39 AM  PATIENT:  Marie Singh  36 y.o. female  Patient Care Team: [name], MD as PCP - General (Family Medicine)  PRE-OPERATIVE DIAGNOSIS:  ANAL LESION  POST-OPERATIVE DIAGNOSIS:  ANAL FISTULA  PROCEDURE:  ANAL EXAM UNDER ANESTHESIA with INTERROGATION OF FISTULA TRACT, FISTULOTOMY, EXCISION OF ANAL SKIN TAG    Surgeon(s): Leighton Ruff, MD  ASSISTANT: Gwynn Burly, MD   ANESTHESIA:   local and MAC  SPECIMEN:  Source of Specimen:  anal skin tag  DISPOSITION OF SPECIMEN:  PATHOLOGY  COUNTS:  YES  PLAN OF CARE: Discharge to home after PACU  PATIENT DISPOSITION:  PACU - hemodynamically stable.  INDICATION: 36 y.o. F with ongoing anal pain and drainage   OR FINDINGS: anterior anal fistula within skin tag  DESCRIPTION: the patient was identified in the preoperative holding area and taken to the OR where they were laid on the operating room table.  MAC anesthesia was induced without difficulty. The patient was then positioned in prone jackknife position with buttocks gently taped apart.  The patient was then prepped and draped in usual sterile fashion.  SCDs were noted to be in place prior to the initiation of anesthesia. A surgical timeout was performed indicating the correct patient, procedure, positioning and need for preoperative antibiotics.  A rectal block was performed using Marcaine with epinephrine mixed with Experel.    I began with a digital rectal exam.  The anal canal was gently dilated to approximately 2 fingerbreadths.  An internal opening could be palpated in the distal portion of the anal canal at anterior midline.  I then placed a Hill-Ferguson anoscope into the anal canal and evaluated this completely.  The patient did have a grade 1 left lateral internal hemorrhoid.  There was good sphincter tone.  The anterior anal skin tag was evaluated.  Fistula probe was used to identify an opening within the skin tag.  This easily traversed to the  internal opening.  There was no sphincter complex involved.  A fistulotomy was performed using electrocautery.  The remaining portion of the skin tag was excised also using electrocautery and sent to pathology for further examination.  Hemostasis was achieved.  The tract was marsupialized using a running 2-0 chromic suture.  Additional Marcaine was placed around the incision site for postoperative pain control.  A sterile dressing was applied.  The patient was then awakened from anesthesia and sent to the postanesthesia care unit in stable condition.  All counts were correct per operating room staff.I was personally present during the key and critical portions of this procedure and immediately available throughout the entire procedure, as documented in my operative note.   Rosario Adie, MD  [facility]

## 2022-06-26 NOTE — Discharge Instructions (Addendum)
ANORECTAL SURGERY: POST OP INSTRUCTIONS Take your usually prescribed home medications unless otherwise directed. DIET: During the first few hours after surgery sip on some liquids until you are able to urinate.  It is normal to not urinate for several hours after this surgery.  If you feel uncomfortable, please contact the office for instructions.  After you are able to urinate,you may eat, if you feel like it.  Follow a light bland diet the first 24 hours after arrival home, such as soup, liquids, crackers, etc.  Be sure to include lots of fluids daily (6-8 glasses).  Avoid fast food or heavy meals, as your are more likely to get nauseated.  Eat a low fat diet the next few days after surgery.  Limit caffeine intake to 1-2 servings a day. PAIN CONTROL: Pain is best controlled by a usual combination of several different methods TOGETHER: Muscle relaxation: Soak in a warm bath (or Sitz bath) three times a day and after bowel movements.  Continue to do this until all pain is resolved. Over the counter pain medication Prescription pain medication Most patients will experience some swelling and discomfort in the anus/rectal area and incisions.  Heat such as warm towels, sitz baths, warm baths, etc to help relax tight/sore spots and speed recovery.  Some people prefer to use ice, especially in the first couple days after surgery, as it may decrease the pain and swelling, or alternate between ice & heat.  Experiment to what works for you.  Swelling and bruising can take several weeks to resolve.  Pain can take even longer to completely resolve. It is helpful to take an over-the-counter pain medication regularly for the first few weeks.  Choose one of the following that works best for you: Naproxen (Aleve, etc)  Two 220mg tabs twice a day Ibuprofen (Advil, etc) Three 200mg tabs four times a day (every meal & bedtime) A  prescription for pain medication (such as percocet, oxycodone, hydrocodone, etc) should be  given to you upon discharge.  Take your pain medication as prescribed.  If you are having problems/concerns with the prescription medicine (does not control pain, nausea, vomiting, rash, itching, etc), please call us [phone] to see if we need to switch you to a different pain medicine that will work better for you and/or control your side effect better. If you need a refill on your pain medication, please contact your pharmacy.  They will contact our office to request authorization. Prescriptions will not be filled after 5 pm or on week-ends. KEEP YOUR BOWELS REGULAR and AVOID CONSTIPATION The goal is one to two soft bowel movements a day.  You should at least have a bowel movement every other day. Avoid getting constipated.  Between the surgery and the pain medications, it is common to experience some constipation. This can be very painful after rectal surgery.  Increasing fluid intake and taking a fiber supplement (such as Metamucil, Citrucel, FiberCon, etc) 1-2 times a day regularly will usually help prevent this problem from occurring.  A stool softener like colace is also recommended.  This can be purchased over the counter at your pharmacy.  You can take it up to 3 times a day.  If you do not have a bowel movement after 24 hrs since your surgery, take one does of milk of magnesia.  If you still haven't had a bowel movement 8-12 hours after that dose, take another dose.  If you don't have a bowel movement 48 hrs after surgery,   purchase a Fleets enema from the drug store and administer gently per package instructions.  If you still are having trouble with your bowel movements after that, please call the office for further instructions. If you develop diarrhea or have many loose bowel movements, simplify your diet to bland foods & liquids for a few days.  Stop any stool softeners and decrease your fiber supplement.  Switching to mild anti-diarrheal medications (Kayopectate, Pepto Bismol) can help.   If this worsens or does not improve, please call us.  Wound Care Remove your bandages before your first bowel movement or 8 hours after surgery.     Remove any wound packing material at this tim,e as well.  You do not need to repack the wound unless instructed otherwise.  Wear an absorbent pad or soft cotton gauze in your underwear to catch any drainage and help keep the area clean. You should change this every 2-3 hours while awake. Keep the area clean and dry.  Bathe / shower every day, especially after bowel movements.  Keep the area clean by showering / bathing over the incision / wound.   It is okay to soak an open wound to help wash it.  Wet wipes or showers / gentle washing after bowel movements is often less traumatic than regular toilet paper. You may have some styrofoam-like soft packing in the rectum which will come out with the first bowel movement.  You will often notice bleeding with bowel movements.  This should slow down by the end of the first week of surgery Expect some drainage.  This should slow down, too, by the end of the first week of surgery.  Wear an absorbent pad or soft cotton gauze in your underwear until the drainage stops. Do Not sit on a rubber or pillow ring.  This can make you symptoms worse.  You may sit on a soft pillow if needed.  ACTIVITIES as tolerated:   You may resume regular (light) daily activities beginning the next day--such as daily self-care, walking, climbing stairs--gradually increasing activities as tolerated.  If you can walk 30 minutes without difficulty, it is safe to try more intense activity such as jogging, treadmill, bicycling, low-impact aerobics, swimming, etc. Save the most intensive and strenuous activity for last such as sit-ups, heavy lifting, contact sports, etc  Refrain from any heavy lifting or straining until you are off narcotics for pain control.   You may drive when you are no longer taking prescription pain medication, you can  comfortably sit for long periods of time, and you can safely maneuver your car and apply brakes. You may have sexual intercourse when it is comfortable.  FOLLOW UP in our office Please call [facility] at [phone] to set up an appointment to see your surgeon in the office for a follow-up appointment approximately 3-4 weeks after your surgery. Make sure that you call for this appointment the day you arrive home to insure a convenient appointment time. 10. IF YOU HAVE DISABILITY OR FAMILY LEAVE FORMS, BRING THEM TO THE OFFICE FOR PROCESSING.  DO NOT GIVE THEM TO YOUR DOCTOR.     WHEN TO CALL us [phone]: Poor pain control Reactions / problems with new medications (rash/itching, nausea, etc)  Fever over 101.5 F (38.5 C) Inability to urinate Nausea and/or vomiting Worsening swelling or bruising Continued bleeding from incision. Increased pain, redness, or drainage from the incision  The clinic staff is available to answer your questions during regular business hours (8:30am-5pm).  Please don't hesitate to call and ask to speak to one of our nurses for clinical concerns.   A surgeon from [facility] is always on call at the hospitals   If you have a medical emergency, go to the nearest emergency room or call 911.    [facility], [address], [place], [place], [place]  [address] ? MAIN: [phone] ? TOLL FREE: [phone] ? FAX [phone] [email or web address] Information for Discharge Teaching:  Information for Discharge Teaching: EXPAREL (bupivacaine liposome injectable suspension)   Your surgeon or anesthesiologist gave you EXPAREL(bupivacaine) to help control your pain after surgery.  EXPAREL is a local anesthetic that provides pain relief by numbing the tissue around the surgical site. EXPAREL is designed to release pain medication over time and can control pain for up to 72 hours. Depending on how you respond to  EXPAREL, you may require less pain medication during your recovery.  Possible side effects: Temporary loss of sensation or ability to move in the area where bupivacaine was injected. Nausea, vomiting, constipation Rarely, numbness and tingling in your mouth or lips, lightheadedness, or anxiety may occur. Call your doctor right away if you think you may be experiencing any of these sensations, or if you have other questions regarding possible side effects.  Follow all other discharge instructions given to you by your surgeon or nurse. Eat a healthy diet and drink plenty of water or other fluids.  If you return to the hospital for any reason within 96 hours following the administration of EXPAREL, it is important for health care providers to know that you have received this anesthetic. A teal colored band has been placed on your arm with the date, time and amount of EXPAREL you have received in order to alert and inform your health care providers. Please leave this armband in place for the full 96 hours following administration, and then you may remove the band.    Post Anesthesia Home Care Instructions  Activity: Get plenty of rest for the remainder of the day. A responsible individual must stay with you for 24 hours following the procedure.  For the next 24 hours, DO NOT: -Drive a car -Paediatric nurse -Drink alcoholic beverages -Take any medication unless instructed by your physician -Make any legal decisions or sign important papers.  Meals: Start with liquid foods such as gelatin or soup. Progress to regular foods as tolerated. Avoid greasy, spicy, heavy foods. If nausea and/or vomiting occur, drink only clear liquids until the nausea and/or vomiting subsides. Call your physician if vomiting continues.  Special Instructions/Symptoms: Your throat may feel dry or sore from the anesthesia or the breathing tube placed in your throat during surgery. If this causes discomfort, gargle with  warm salt water. The discomfort should disappear within 24 hours.  No Tylenol until after 1:00 p.m.

## 2022-06-26 NOTE — Anesthesia Postprocedure Evaluation (Signed)
Anesthesia Post Note  Patient: Marie Singh  Procedure(s) Performed: EXCISION OF ANAL SKIN TAG, POSSIBLE FISTULOTOMY (Rectum) RECTAL EXAM UNDER ANESTHESIA (Rectum)     Patient location during evaluation: PACU Anesthesia Type: MAC Level of consciousness: awake and alert Pain management: pain level controlled Vital Signs Assessment: post-procedure vital signs reviewed and stable Respiratory status: spontaneous breathing, nonlabored ventilation and respiratory function stable Cardiovascular status: stable and blood pressure returned to baseline Anesthetic complications: no   No notable events documented.  Last Vitals:  Vitals:   06/26/22 1048 06/26/22 1130  BP: 127/75 137/84  Pulse: 99 95  Resp: 16 15  Temp: 36.9 C 36.9 C  SpO2: 100% 100%    Last Pain:  Vitals:   06/26/22 1130  TempSrc:   PainSc: 0-No pain                 Audry Pili

## 2022-06-26 NOTE — Anesthesia Procedure Notes (Signed)
Procedure Name: MAC Date/Time: 06/26/2022 10:25 AM  Performed by: Bonney Aid, CRNAPre-anesthesia Checklist: Patient identified, Emergency Drugs available, Suction available, Patient being monitored and Timeout performed Patient Re-evaluated:Patient Re-evaluated prior to induction Oxygen Delivery Method: Nasal cannula Placement Confirmation: positive ETCO2

## 2022-06-29 ENCOUNTER — Encounter (HOSPITAL_BASED_OUTPATIENT_CLINIC_OR_DEPARTMENT_OTHER): Payer: Self-pay | Admitting: General Surgery

## 2022-06-29 LAB — SURGICAL PATHOLOGY

## 2022-08-25 ENCOUNTER — Ambulatory Visit: Payer: BC Managed Care – PPO | Admitting: Nurse Practitioner

## 2022-08-25 ENCOUNTER — Encounter: Payer: Self-pay | Admitting: Nurse Practitioner

## 2022-08-25 VITALS — BP 106/70 | HR 66 | Temp 98.4°F | Wt 205.8 lb

## 2022-08-25 DIAGNOSIS — T7840XA Allergy, unspecified, initial encounter: Secondary | ICD-10-CM | POA: Diagnosis not present

## 2022-08-25 DIAGNOSIS — R21 Rash and other nonspecific skin eruption: Secondary | ICD-10-CM

## 2022-08-25 MED ORDER — CETIRIZINE HCL 10 MG PO TABS: 10.0000 mg | ORAL_TABLET | Freq: Every day | ORAL | 3 refills | Status: AC

## 2022-08-25 NOTE — Progress Notes (Signed)
   Subjective:    Patient ID: Marie Singh, female    DOB: 1986-08-12, 36 y.o.   MRN: [id number]  Allergic Reaction    36 year old female patient with history of allergy to tree nuts presents to the clinic today with complaints of itchiness, hives to face chest arms backs and leg, and lip swelling that occurred at 1:00 AM.  Patient states that she ate hibachi around 5:30 PM and ate 1 hockey-stick and then woke up in the middle the night to itching, hives, and mild lip swelling.  Patient denies any difficulty breathing or shortness of breath or throat swelling.  Patient states she currently took 2 Benadryl which improved her symptoms.  Patient currently does not have any hives, lip swelling, or itchiness.    Patient states that she did not have to use her albuterol inhaler.  Patient is unsure what she could have reacted to.  Patient does not believe that there were any tree nuts in her meal.   Review of Systems  Skin:        Hives, itching, lip swelling       Objective:   Physical Exam Vitals reviewed.  Constitutional:      General: She is not in acute distress.    Appearance: Normal appearance. She is normal weight. She is not ill-appearing, toxic-appearing or diaphoretic.  HENT:     Head: Normocephalic and atraumatic.  Cardiovascular:     Rate and Rhythm: Normal rate and regular rhythm.     Pulses: Normal pulses.     Heart sounds: Normal heart sounds. No murmur heard. Pulmonary:     Effort: Pulmonary effort is normal. No respiratory distress.     Breath sounds: Normal breath sounds. No wheezing.  Musculoskeletal:     Comments: Grossly intact  Skin:    General: Skin is warm.     Capillary Refill: Capillary refill takes less than 2 seconds.     Comments: No facial swelling, hives, rashes noted on exam  Neurological:     Mental Status: She is alert.     Comments: Grossly intact  Psychiatric:        Mood and Affect: Mood normal.        Behavior: Behavior normal.            Assessment & Plan:   1. Allergic reaction, initial encounter -We will refer patient to allergy clinic for further allergy testing to determine if patient has developed more allergies. -Also encourage patient to take a daily Zyrtec to help with histamine flareups. - Ambulatory referral to Allergy - cetirizine (ZYRTEC ALLERGY) 10 MG tablet; Take 1 tablet (10 mg total) by mouth daily.  Dispense: 30 tablet; Refill: 3 -Return to clinic if symptoms return  2. Rash -Take Zyrtec daily. - cetirizine (ZYRTEC ALLERGY) 10 MG tablet; Take 1 tablet (10 mg total) by mouth daily.  Dispense: 30 tablet; Refill: 3    Note:  This document was prepared using Dragon voice recognition software and may include unintentional dictation errors. Note - This record has been created using [employer].  Chart creation errors have been sought, but may not always  have been located. Such creation errors do not reflect on  the standard of medical care.

## 2022-08-26 ENCOUNTER — Encounter: Payer: Self-pay | Admitting: Family Medicine

## 2022-08-26 ENCOUNTER — Ambulatory Visit (INDEPENDENT_AMBULATORY_CARE_PROVIDER_SITE_OTHER): Payer: BC Managed Care – PPO | Admitting: Family Medicine

## 2022-08-26 VITALS — BP 102/78 | HR 68 | Temp 98.1°F | Ht 64.5 in | Wt 204.0 lb

## 2022-08-26 DIAGNOSIS — L509 Urticaria, unspecified: Secondary | ICD-10-CM

## 2022-08-26 MED ORDER — PREDNISONE 20 MG PO TABS: ORAL_TABLET | ORAL | 0 refills | Status: AC

## 2022-08-26 MED ORDER — FAMOTIDINE 20 MG PO TABS: 20.0000 mg | ORAL_TABLET | Freq: Two times a day (BID) | ORAL | 0 refills | Status: DC

## 2022-08-26 NOTE — Progress Notes (Signed)
   Subjective:    Patient ID: Marie Singh, female    DOB: [birth year], 36 y.o.   MRN: [id number]  HPI Hives and itching all over body for 2 days  Unknown cause , has been taking benadryl Patient was recently seen for hives was started on Zyrtec now the hives have become severe causing her significant itching and discomfort.  She has frequent allergy related issues  Review of Systems     Objective:   Physical Exam  Lungs are clear heart regular no sign of dysphagia or swallowing difficulties has extensive hives on the legs abdomen chest      Assessment & Plan:  Significant angioedema Patient allergy list include MiraLAX which made it difficult for [place] to give her Depo-Medrol because of potential cross-reactivity of the vehicle that carries Depo-Medrol We have referred her to allergist for further delineation of her true allergies Prednisone taper over the next 8 days Pepcid twice daily Zyrtec daily Benadryl as needed If ongoing troubles or problems follow-up if any emergency issues go to ER patient aware

## 2022-09-17 ENCOUNTER — Other Ambulatory Visit: Payer: Self-pay | Admitting: Family Medicine

## 2022-10-05 ENCOUNTER — Other Ambulatory Visit: Payer: Self-pay | Admitting: Family Medicine

## 2022-10-06 ENCOUNTER — Other Ambulatory Visit: Payer: Self-pay | Admitting: Family Medicine

## 2022-10-06 DIAGNOSIS — J4520 Mild intermittent asthma, uncomplicated: Secondary | ICD-10-CM

## 2022-10-08 NOTE — Telephone Encounter (Signed)
So this is almost like a game of " guess what inhaler the insurance will cover?" May try Qvar 80 mcg 2 inhalations twice a day Obviously it would be very helpful if the insurance company would let us know what other alternatives is covered Certainly if you guys find out please let me know

## 2022-10-09 ENCOUNTER — Encounter: Payer: Self-pay | Admitting: Internal Medicine

## 2022-10-09 ENCOUNTER — Other Ambulatory Visit: Payer: Self-pay

## 2022-10-09 ENCOUNTER — Ambulatory Visit: Payer: BC Managed Care – PPO | Admitting: Internal Medicine

## 2022-10-09 VITALS — BP 104/68 | HR 80 | Temp 98.4°F | Resp 14 | Ht 64.96 in | Wt 208.2 lb

## 2022-10-09 DIAGNOSIS — J4520 Mild intermittent asthma, uncomplicated: Secondary | ICD-10-CM

## 2022-10-09 DIAGNOSIS — L501 Idiopathic urticaria: Secondary | ICD-10-CM

## 2022-10-09 DIAGNOSIS — J3089 Other allergic rhinitis: Secondary | ICD-10-CM | POA: Diagnosis not present

## 2022-10-09 DIAGNOSIS — L500 Allergic urticaria: Secondary | ICD-10-CM

## 2022-10-09 DIAGNOSIS — J302 Other seasonal allergic rhinitis: Secondary | ICD-10-CM

## 2022-10-09 DIAGNOSIS — H1013 Acute atopic conjunctivitis, bilateral: Secondary | ICD-10-CM | POA: Diagnosis not present

## 2022-10-09 MED ORDER — AZELASTINE HCL 0.1 % NA SOLN
1.0000 | Freq: Two times a day (BID) | NASAL | 5 refills | Status: DC
Start: 2022-10-09 — End: 2022-11-20

## 2022-10-09 MED ORDER — FLUTICASONE PROPIONATE 50 MCG/ACT NA SUSP: 2.0000 | Freq: Every day | NASAL | 5 refills | Status: DC

## 2022-10-09 MED ORDER — FEXOFENADINE HCL 180 MG PO TABS
180.0000 mg | ORAL_TABLET | Freq: Two times a day (BID) | ORAL | 5 refills | Status: DC | PRN
Start: 2022-10-09 — End: 2022-11-20

## 2022-10-09 MED ORDER — OLOPATADINE HCL 0.2 % OP SOLN: 1.0000 [drp] | Freq: Every day | OPHTHALMIC | 5 refills | Status: DC | PRN

## 2022-10-09 MED ORDER — FAMOTIDINE 20 MG PO TABS: 20.0000 mg | ORAL_TABLET | Freq: Two times a day (BID) | ORAL | 5 refills | Status: AC | PRN

## 2022-10-09 NOTE — Progress Notes (Addendum)
NEW PATIENT  Date of Service/Encounter:  10/09/22  Consult requested by: [name], MD   Subjective:   Marie Singh (DOB: 1986-05-01) is a 36 y.o. female who presents to the clinic on 10/09/2022 with a chief complaint of Allergic Reaction and Sinus Problem .    History obtained from: chart review and patient.  Asthma:  Diagnosed at age 59s.  Mostly occurs with seasonal allergy flare ups where she develops SOB/coughing and rarely wheezing requiring inhaler use. She feels like her asthma is pretty well controlled. She has used Flovent 165mg only once this month and Albuterol about 6-8 times this month; the increased use of albuterol was due to her seasonal allergy flare this Fall.  They did recently switch her from Flovent to Qvar due to insurance coverage.  She sings, dances and works out without any issues.   Limitations to daily activity: none 0 ED visits, 0 UC visits and 1 oral steroids in the past year 0 number of lifetime hospitalizations, 0 number of lifetime intubations.  Identified Triggers: respiratory illness and cold air Prior PFTs or spirometry: none Current regimen:  Maintenance: Flovent 1152m 2 puffs BID but taking it PRN. Rescue: Albuterol 2 puffs q4-6 hrs PRN  Rhinitis:  Started in 3063s  Symptoms include: nasal congestion, rhinorrhea, post nasal drainage, sneezing, watery eyes, and itchy eyes  Occurs seasonally-Spring and Fall Potential triggers: Pollen Treatments tried:  Mucinex D Zyrtec PRN Flonase PRN  Previous allergy testing: yes in 2017; can't recall results History of reflux/heartburn: no History of chronic sinusitis or sinus surgery: no Nonallergic triggers: none   Hives/Food Allergy/Drug Allergy: Reports around age [age]she developed hives several hours after eating cashews, no other symptoms. Prior to that, she was eating treenuts frequently without any symptoms. She was tested by an Allergist and told she was positive and to avoid  treenuts.  Since then has avoided all treenuts. She has an Epipen but has never had to use it.   Then she had another recurrence of hives in 2017 and had taken metronidazole for a gynecologic infection and developed hives about 1 week after finishing metronidazole.  She was then told to avoid metronidazole as there is no clear test for this.    Then in 02/2022, she underwent colonoscopy and had taken dulcolax and prep material and a day later, developed hives again.   Then in 08/2022, woke up in the middle of the night and had hives around 1 AM.  Took benadryl and it subsided but it returned so she was given a prednisone taper by PCP.  She had eaten hibachi that evening around 7 AM with steak, shrimp, chicken.   Past Medical History: Past Medical History:  Diagnosis Date   Anal lesion    Asthma    Dysphonia    followed by Dr [facility])--- vocal fold atrophy,  speech therapy   H/O varicella    History of cervical dysplasia    CIN 1,  CIN 2----   s/p LEEP procedure's   History of gastrointestinal ulcer    followed by Dr D. [facility]ny Masters[facility])   IBS (irritable bowel syndrome)    Migraine    Frequent   PCOS (polycystic ovarian syndrome) 2009   Seasonal allergic rhinitis    Wears contact lenses    Past Surgical History: Past Surgical History:  Procedure Laterality Date   BREAST REDUCTION SURGERY Bilateral 04/13/2011   '@[facility]'$  by dr trGeorgia Lopes CERVICAL BIOPSY  W/ LOOP  ELECTRODE EXCISION Bilateral 03/29/2020   COLONOSCOPY W/ BIOPSIES  03/04/2022   by dr d. Sonny Masters   EXCISION OF SKIN TAG N/A 06/26/2022   Procedure: EXCISION OF ANAL SKIN TAG, POSSIBLE FISTULOTOMY;  Surgeon: Leighton Ruff, MD;  Location: [facility];  Service: General;  Laterality: N/A;   MULTIPLE TOOTH EXTRACTIONS  2022   dental office w/ sedation   RECTAL EXAM UNDER ANESTHESIA N/A 06/26/2022   Procedure: RECTAL EXAM UNDER ANESTHESIA;  Surgeon: Leighton Ruff, MD;  Location: [place];  Service:  General;  Laterality: N/A;    Family History: Family History  Problem Relation Age of Onset   Diabetes Mother    Allergic rhinitis Mother    AAA (abdominal aortic aneurysm) Mother    Food Allergy Mother    Diverticulosis Mother    Heart attack Father    Hypertension Father    Allergic rhinitis Sister    Crohn's disease Brother    Angioedema Neg Hx    Asthma Neg Hx    Eczema Neg Hx    Immunodeficiency Neg Hx    Urticaria Neg Hx     Social History:  No history of smoking  Medication List:  Allergies as of 10/09/2022       Reactions   Amoxicillin Anaphylaxis, Hives   Can take cephalosporins   Dulcolax [bisacodyl] Hives   Justicia Adhatoda (malabar Nut Tree) [justicia Adhatoda] Hives   Metronidazole    urticaria   Miralax [polyethylene Glycol] Hives        Medication List        Accurate as of October 09, 2022  1:37 PM. If you have any questions, ask your nurse or doctor.          albuterol 108 (90 Base) MCG/ACT inhaler Commonly known as: VENTOLIN HFA Inhale 2 puffs into the lungs as needed.   albuterol 108 (90 Base) MCG/ACT inhaler Commonly known as: VENTOLIN HFA 2 PUFFS EVERY 4 HOURS AS NEEDED FOR COUGH OR WHEEZE. MAY USE 2 PUFFS 10-20 MINUTES PRIOR TO EXERCISE.   cetirizine 10 MG tablet Commonly known as: ZyrTEC Allergy Take 1 tablet (10 mg total) by mouth daily.   diphenhydrAMINE 25 MG tablet Commonly known as: BENADRYL Take 25 mg by mouth every 6 (six) hours as needed.   EPINEPHrine 0.3 mg/0.3 mL Soaj injection Commonly known as: EPI-PEN Inject 0.3 mg into the muscle once.   famotidine 20 MG tablet Commonly known as: PEPCID TAKE 1 TABLET BY MOUTH TWICE A DAY   Flovent HFA 110 MCG/ACT inhaler Generic drug: fluticasone Inhale 2 puffs into the lungs 2 (two) times daily.   fluticasone 50 MCG/ACT nasal spray Commonly known as: FLONASE Place 1 spray into both nostrils as needed.   Magnesium 200 MG Tabs Take 1 tablet by mouth 2 (two) times  daily.   methocarbamol 500 MG tablet Commonly known as: ROBAXIN Take 500 mg by mouth every 8 (eight) hours as needed for muscle spasms.   predniSONE 20 MG tablet Commonly known as: DELTASONE 2 po qd for 4d then 1qd for 4d   Prenatal Vitamin 27-0.8 MG Tabs daily.   Qvar RediHaler 80 MCG/ACT inhaler Generic drug: beclomethasone Inhale 2 puffs into the lungs 2 (two) times daily.         REVIEW OF SYSTEMS: Pertinent positives and negatives discussed in HPI.   Objective:   Physical Exam: BP 104/68   Pulse 80   Temp 98.4 F (36.9 C) (Temporal)   Resp 14  Ht 5' 4.96" (1.65 m)   Wt 208 lb 3.2 oz (94.4 kg)   SpO2 97%   BMI 34.69 kg/m  Body mass index is 34.69 kg/m. GEN: alert, well developed HEENT: clear conjunctiva, TM grey and translucent, nose with + inferior turbinate hypertrophy, pale nasal mucosa, slight clear rhinorrhea, + cobblestoning HEART: regular rate and rhythm, no murmur LUNGS: clear to auscultation bilaterally, no coughing, unlabored respiration ABDOMEN: soft, non distended  SKIN: no rashes or lesions  Reviewed:  Multiple PCP visits in HPI  Spirometry:  Tracings reviewed. Her effort: Variable effort-results affected. FVC: 3.46L; post 3.65L FEV1: 2.17L, 83% predicted; post 2.77L, 106% FEV1/FVC ratio: 63%; post 76% Interpretation: Spirometry consistent with mild obstructive disease. With clinically significant reversibility.  Please see scanned spirometry results for details.  Skin Testing:  Skin prick testing was placed, which includes aeroallergens/foods, histamine control, and saline control.  Verbal consent was obtained prior to placing test.  Patient tolerated procedure well.  Allergy testing results were read and interpreted by myself, documented by clinical staff. Adequate positive and negative control.  Results discussed with patient/family.  Airborne Adult Perc - 10/09/22 1000     Time Antigen Placed 1011    Allergen Manufacturer Greer     Location Back    Number of Test 59    2. Control-Histamine 1 mg/ml 3+    4. Imogene 3+    5. [place] Negative    6. Johnson Negative    7. Kentucky Blue 3+    8. Meadow Fescue 3+    9. Perennial Rye 3+    10. Sweet Vernal 3+    11. Timothy 3+    12. Cocklebur Negative    13. Burweed Marshelder 3+    14. Ragweed, short 3+    15. Ragweed, Giant Negative    16. Plantain,  English Negative    17. Lamb's Quarters Negative    18. Sheep Sorrell Negative    19. Rough Pigweed 3+    20. Marsh Elder, Rough Negative    21. Mugwort, Common 3+    22. Ash mix Negative    23. Birch mix Negative    24. Beech American Negative    25. Box, Elder Negative    26. Cedar, red Negative    27. Cottonwood, [place] Negative    28. Elm mix Negative    29. Hickory 3+    30. Maple mix Negative    31. Oak, [place] mix 3+    32. Pecan Pollen Negative    33. Pine mix Negative    34. Sycamore Eastern Negative    35. [place], Black Pollen Negative    36. Alternaria alternata Negative    37. Cladosporium Herbarum Negative    38. Aspergillus mix Negative    39. Penicillium mix Negative    40. Bipolaris sorokiniana (Helminthosporium) Negative    41. Drechslera spicifera (Curvularia) Negative    42. Mucor plumbeus Negative    43. Fusarium moniliforme Negative    44. Aureobasidium pullulans (pullulara) Negative    45. Rhizopus oryzae Negative    46. Botrytis cinera Negative    47. Epicoccum nigrum Negative    48. Phoma betae Negative    49. Candida Albicans Negative    50. Trichophyton mentagrophytes Negative    51. Mite, D Farinae  5,000 AU/ml 3+    52. Mite, D Pteronyssinus  5,000 AU/ml 3+    53. Cat Hair 10,000 BAU/ml Negative    54.  Dog Epithelia Negative  55. Mixed Feathers Negative    56. Horse Epithelia Negative    57. Cockroach, German Negative    58. Mouse Negative    59. Tobacco Leaf Negative             Intradermal - 10/09/22 1052     Time Antigen Placed 1052    Allergen  Manufacturer Lavella Hammock    Location Arm    Number of Test 10    Intradermal Select    Control Negative    Guatemala Negative    Johnson 3+    Mold 1 Negative    Mold 2 2+    Mold 3 Negative    Mold 4 2+    Cat Negative    Dog Negative    Cockroach Negative             Food Adult Perc - 10/09/22 1000     Time Antigen Placed 1012    Allergen Manufacturer Greer    Location Back    Number of allergen test 8    10. Cashew Negative    11. Pecan Food Negative    12. [place] Negative    13. Almond Negative    14. Hazelnut Negative    15. [place] nut Negative    16. Coconut Negative    17. Pistachio Negative               Assessment:   1. 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 Seasonal and perennial allergic rhinitis   3. Allergic conjunctivitis of both eyes   4. Mild intermittent asthma without complication   5. Chronic idiopathic urticaria     Plan/Recommendations:  Food allergy- urticaria with cashew - please strictly avoid treenuts. SPT was negative today 10/2022.  Will confirm with sIgE. If both are negative, we can do in office challenge or reintroduction at home.   - for SKIN only reaction, okay to take Benadryl '25mg'$  capsules every 6 hours - for SKIN + ANY additional symptoms, OR IF concern for LIFE THREATENING reaction = Epipen Autoinjector EpiPen 0.3 mg. - If using Epinephrine autoinjector, call 911 or go thte ER.   Mild Intermittent Asthma: - Keep track of your symptoms; spirometry showed reversibility today but she is doing well symptomatically and could be due to effort.  - MDI technique discussed.   - Maintenance inhaler: none - If you have a flare up or any respiratory illness, start Qvar 85mg 1 puffs twice daily for 1-2 weeks. - Rescue inhaler: Albuterol 2 puffs via spacer or 1 vial via nebulizer every 4-6 hours as needed for respiratory symptoms of cough, shortness of breath, or wheezing Asthma control goals:  Full participation in all  desired activities (may need albuterol before activity) Albuterol use two times or less a week on average (not counting use with activity) Cough interfering with sleep two times or less a month Oral steroids no more than once a year No hospitalizations  Allergic Rhinitis Allergic Conjunctivitis - Positive skin test 09/2022 to grasses, weeds, trees, DM, mold - Avoidance measures discussed. - Use nasal saline rinses before nose sprays such as with Neilmed Sinus Rinse.  Use distilled water.   - Use Flonase 2 sprays each nostril daily. Aim upward and outward. - Use Azelastine 1-2 sprays each nostril twice daily as needed. Aim upward and outward. - Use Allegra '180mg'$  daily as needed for runny nose or itchy watery eyes.  - For eyes, use Olopatadine or Ketotifen 1 eye drop  daily as needed for itchy, watery eyes.  Available over the counter, if not covered by insurance.  - Consider allergy shots as long term control of your symptoms by teaching your immune system to be more tolerant of your allergy triggers  Idiopathic Urticaria (Hives): - I wonder if she has chronic idiopathic urticaria rather than food/drug allergies as the timeline does not fit with an immediate reaction to metronidazole or treenuts.  There are also other episodes where she has not been exposed to either and still having hives.  - Start Allegra '180mg'$  daily if hives recur.   - If no improvement in 2-3 days, increase to Allegra '180mg'$  twice daily.   - If no improvement in 2-3 days, add Pepcid '20mg'$  twice daily and continue Allegra '180mg'$  twice daily. - If still no improvement, increase to Allegra '360mg'$  twice daily and Pepcid '40mg'$  twice daily. - If reoccurs, can consider bloodwork.   Return in about 6 weeks (around 11/20/2022).  Harlon Flor, MD [facility]

## 2022-10-09 NOTE — Patient Instructions (Addendum)
Food allergy:  - please strictly avoid treenuts. SPT was negative today 10/2022.  Will confirm with sIgE.  - for SKIN only reaction, okay to take Benadryl '25mg'$  capsules every 6 hours - for SKIN + ANY additional symptoms, OR IF concern for LIFE THREATENING reaction = Epipen Autoinjector EpiPen 0.3 mg. - If using Epinephrine autoinjector, call 911 or go thte ER.   Asthma: - MDI technique discussed.   - Maintenance inhaler: none - If you have a flare up or any respiratory illness, start Qvar 55mg 1 puffs twice daily for 1-2 weeks. - Rescue inhaler: Albuterol 2 puffs via spacer or 1 vial via nebulizer every 4-6 hours as needed for respiratory symptoms of cough, shortness of breath, or wheezing Asthma control goals:  Full participation in all desired activities (may need albuterol before activity) Albuterol use two times or less a week on average (not counting use with activity) Cough interfering with sleep two times or less a month Oral steroids no more than once a year No hospitalizations  Rhinitis: - Positive skin test 09/2022 to grasses, weeds, trees, DM, mold - Avoidance measures discussed. - Use nasal saline rinses before nose sprays such as with Neilmed Sinus Rinse.  Use distilled water.   - Use Flonase 2 sprays each nostril daily. Aim upward and outward. - Use Azelastine 1-2 sprays each nostril twice daily as needed. Aim upward and outward. - Use Allegra '180mg'$  daily as needed for runny nose or itchy watery eyes.  - For eyes, use Olopatadine or Ketotifen 1 eye drop daily as needed for itchy, watery eyes.  Available over the counter, if not covered by insurance.  - Consider allergy shots as long term control of your symptoms by teaching your immune system to be more tolerant of your allergy triggers  Idiopathic Urticaria (Hives): - Start Allegra '180mg'$  daily if hives recur.   - If no improvement in 2-3 days, increase to Allegra '180mg'$  twice daily.   - If no improvement in 2-3 days, add  Pepcid '20mg'$  twice daily and continue Allegra '180mg'$  twice daily. - If still no improvement, increase to Allegra '360mg'$  twice daily and Pepcid '40mg'$  twice daily.   ALLERGEN AVOIDANCE MEASURES   Dust Mites Use central air conditioning and heat; and change the filter monthly.  Pleated filters work better than mesh filters.  Electrostatic filters may also be used; wash the filter monthly.  Window air conditioners may be used, but do not clean the air as well as a central air conditioner.  Change or wash the filter monthly. Keep windows closed.  Do not use attic fans.   Encase the mattress, box springs and pillows with zippered, dust proof covers. Wash the bed linens in hot water weekly.   Remove carpet, especially from the bedroom. Remove stuffed animals, throw pillows, dust ruffles, heavy drapes and other items that collect dust from the bedroom. Do not use a humidifier.   Use wood, vinyl or leather furniture instead of cloth furniture in the bedroom. Keep the indoor humidity at 30 - 40%.  Monitor with a humidity gauge.  Molds - Indoor avoidance Use air conditioning to reduce indoor humidity.  Do not use a humidifier. Keep indoor humidity at 30 - 40%.  Use a dehumidifier if needed. In the bathroom use an exhaust fan or open a window after showering.  Wipe down damp surfaces after showering.  Clean bathrooms with a mold-killing solution (diluted bleach, or products like Tilex, etc) at least once a month. In the  kitchen use an exhaust fan to remove steam from cooking.  Throw away spoiled foods immediately, and empty garbage daily.  Empty water pans below self-defrosting refrigerators frequently. Vent the clothes dryer to the outside. Limit indoor houseplants; mold grows in the dirt.  No houseplants in the bedroom. Remove carpet from the bedroom. Encase the mattress and box springs with a zippered encasing.  Molds - Outdoor avoidance Avoid being outside when the grass is being mowed, or the  ground is tilled. Avoid playing in leaves, pine straw, hay, etc.  Dead plant materials contain mold. Avoid going into barns or grain storage areas. Remove leaves, clippings and compost from around the home.  Pollen Avoidance Pollen levels are highest during the mid-day and afternoon.  Consider this when planning outdoor activities. Avoid being outside when the grass is being mowed, or wear a mask if the pollen-allergic person must be the one to mow the grass. Keep the windows closed to keep pollen outside of the home. Use an air conditioner to filter the air. Take a shower, wash hair, and change clothing after working or playing outdoors during pollen season.

## 2022-10-09 NOTE — Telephone Encounter (Signed)
Checked with pharmacy and Qvar was not covered by the insurance either, Symbicort is covered per pharmacist, please advise.

## 2022-10-14 LAB — PANEL 604726
Cor A 1 IgE: 0.15 kU/L — AB
Cor A 14 IgE: <0.1 kU/L
Cor A 8 IgE: <0.1 kU/L
Cor A 9 IgE: <0.1 kU/L

## 2022-10-14 LAB — PEANUT COMPONENTS
F352-IgE Ara h 8: <0.1 kU/L
F422-IgE Ara h 1: <0.1 kU/L
F423-IgE Ara h 2: <0.1 kU/L
F424-IgE Ara h 3: <0.1 kU/L
F427-IgE Ara h 9: <0.1 kU/L
F447-IgE Ara h 6: <0.1 kU/L

## 2022-10-14 LAB — IGE NUT PROF. W/COMPONENT RFLX
F017-IgE Hazelnut (Filbert): 0.16 kU/L — AB
F018-IgE Brazil Nut: <0.1 kU/L
F020-IgE Almond: <0.1 kU/L
F202-IgE Cashew Nut: <0.1 kU/L
F203-IgE Pistachio Nut: <0.1 kU/L
F256-IgE Walnut: <0.1 kU/L
Macadamia Nut, IgE: <0.1 kU/L
Peanut, IgE: 0.14 kU/L — AB
Pecan Nut IgE: <0.1 kU/L

## 2022-10-14 LAB — ALLERGEN COMPONENT COMMENTS

## 2022-10-15 ENCOUNTER — Ambulatory Visit (AMBULATORY_SURGERY_CENTER): Payer: BC Managed Care – PPO | Admitting: *Deleted

## 2022-10-15 ENCOUNTER — Encounter: Payer: Self-pay | Admitting: Internal Medicine

## 2022-10-15 VITALS — Ht 64.5 in | Wt 208.0 lb

## 2022-10-15 DIAGNOSIS — Z86010 Personal history of colonic polyps: Secondary | ICD-10-CM

## 2022-10-15 MED ORDER — SUTAB 1479-225-188 MG PO TABS: 1.0000 | ORAL_TABLET | Freq: Once | ORAL | 0 refills | Status: AC

## 2022-10-15 NOTE — Progress Notes (Signed)
Pt's previsit is done over the phone and all paperwork (prep instructions, blank consent form to just read over) sent to patient.  Pt's name and DOB verified at the beginning of the previsit.  Pt denies any difficulty with ambulating.   No egg or soy allergy known to patient  No issues known to pt with past sedation with any surgeries or procedures Patient denies ever being told they had issues or difficulty with intubation  No FH of Malignant Hyperthermia Pt is not on diet pills Pt is not on  home 02  Pt is not on blood thinners  Pt denies issues with constipation    Per Dr. Lorenso Courier- Nila Nephew RX given

## 2022-10-19 ENCOUNTER — Telehealth: Payer: Self-pay | Admitting: Internal Medicine

## 2022-10-19 DIAGNOSIS — Z86010 Personal history of colonic polyps: Secondary | ICD-10-CM

## 2022-10-19 MED ORDER — NA SULFATE-K SULFATE-MG SULF 17.5-3.13-1.6 GM/177ML PO SOLN: 1.0000 | Freq: Once | ORAL | 0 refills | Status: AC

## 2022-10-19 NOTE — Telephone Encounter (Signed)
Inbound call from patient stating that she is scheduled to have a colonoscopy tomorrow and needs a new prep because she knows she will not be able to do the Sutab pills. Please advise.

## 2022-10-19 NOTE — Telephone Encounter (Signed)
Returned patient call.  Suprep sent to pharmacy per patient request.  Encouraged patient to print good RX coupon before picking prep up.  New instructions sent via MyChart per patient request.

## 2022-10-20 ENCOUNTER — Encounter: Payer: Self-pay | Admitting: Internal Medicine

## 2022-10-20 ENCOUNTER — Ambulatory Visit (AMBULATORY_SURGERY_CENTER): Payer: BC Managed Care – PPO | Admitting: Internal Medicine

## 2022-10-20 VITALS — BP 138/79 | HR 92 | Temp 97.8°F | Resp 18 | Ht 64.5 in | Wt 208.0 lb

## 2022-10-20 DIAGNOSIS — K529 Noninfective gastroenteritis and colitis, unspecified: Secondary | ICD-10-CM

## 2022-10-20 DIAGNOSIS — Z86010 Personal history of colonic polyps: Secondary | ICD-10-CM

## 2022-10-20 DIAGNOSIS — K649 Unspecified hemorrhoids: Secondary | ICD-10-CM

## 2022-10-20 DIAGNOSIS — K635 Polyp of colon: Secondary | ICD-10-CM | POA: Diagnosis not present

## 2022-10-20 DIAGNOSIS — K639 Disease of intestine, unspecified: Secondary | ICD-10-CM

## 2022-10-20 DIAGNOSIS — D123 Benign neoplasm of transverse colon: Secondary | ICD-10-CM

## 2022-10-20 MED ORDER — SODIUM CHLORIDE 0.9 % IV SOLN: 500.0000 mL | Freq: Once | INTRAVENOUS | Status: DC

## 2022-10-20 NOTE — Progress Notes (Signed)
Pt unable to pass air in PACU. Pt with significant discomfort and tightness in upper abdomen. Attempted repositioning side to side and on hands and knees. Also gave Levsin 0.'25mg'$  SL tablets with no relief. MD made aware and assess pt. Pt taken back to procedure room to remove trapped air.

## 2022-10-20 NOTE — Progress Notes (Signed)
Report to PACU, RN, vss, BBS= Clear.  

## 2022-10-20 NOTE — Op Note (Addendum)
[facility] Patient Name: Marie Singh Procedure Date: 10/20/2022 8:19 AM MRN: [id number] Endoscopist: Adline Mango [place] , , [id number] Age: 36 Referring MD:  Date of Birth: 19-Mar-1986 Gender: Female Account #: [id number] Procedure:                Colonoscopy Indications:              Follow-up of colitis Medicines:                Monitored Anesthesia Care Procedure:                Pre-Anesthesia Assessment:                           - Prior to the procedure, a History and Physical                            was performed, and patient medications and                            allergies were reviewed. The patient's tolerance of                            previous anesthesia was also reviewed. The risks                            and benefits of the procedure and the sedation                            options and risks were discussed with the patient.                            All questions were answered, and informed consent                            was obtained. Prior Anticoagulants: The patient has                            taken no anticoagulant or antiplatelet agents. ASA                            Grade Assessment: II - A patient with mild systemic                            disease. After reviewing the risks and benefits,                            the patient was deemed in satisfactory condition to                            undergo the procedure.                           After obtaining informed consent, the colonoscope  was passed under direct vision. Throughout the                            procedure, the patient's blood pressure, pulse, and                            oxygen saturations were monitored continuously. The                            CF HQ190L #[id number] was introduced through the anus                            and advanced to the the terminal ileum. The                            colonoscopy was performed without  difficulty. The                            patient tolerated the procedure well. The quality                            of the bowel preparation was good. The terminal                            ileum, ileocecal valve, appendiceal orifice, and                            rectum were photographed. The PCF-HQ190L                            Colonoscope was introduced through the anus and                            advanced to the the terminal ileum. Scope In: 8:22:33 AM Scope Out: 8:38:08 AM Scope Withdrawal Time: 0 hours 12 minutes 38 seconds  Total Procedure Duration: 0 hours 15 minutes 35 seconds  Findings:                 The terminal ileum appeared normal.                           A 3 mm polyp was found in the transverse colon. The                            polyp was sessile. The polyp was removed with a                            cold snare. Resection and retrieval were complete.                           A localized area of mildly erythematous mucosa was                            found in the  sigmoid colon and in the descending                            colon. This was biopsied with a cold forceps for                            histology.                           Non-bleeding internal hemorrhoids were found during                            retroflexion. Complications:            Patient had some abdominal discomfort after the                            procedure and was unable to pass gas. She was taken                            back to the procedure area for removal of air with                            subsequent improvement in her symptoms. Estimated Blood Loss:     Estimated blood loss was minimal. Impression:               - The examined portion of the ileum was normal.                           - One 3 mm polyp in the transverse colon, removed                            with a cold snare. Resected and retrieved.                           - Erythematous mucosa in the  sigmoid colon and in                            the descending colon. Biopsied.                           - Non-bleeding internal hemorrhoids. Recommendation:           - Discharge patient to home (with escort).                           - Your previously visualized colonic ulcers have                            healed.                           - Await pathology results.                           - The findings and recommendations were discussed  with the patient. Dr Georgian Co "Lyndee Leo" Lorenso Courier,  10/20/2022 8:49:56 AM

## 2022-10-20 NOTE — Progress Notes (Signed)
VS completed by CW.   Pt's states no medical or surgical changes since previsit or office visit.  

## 2022-10-20 NOTE — Progress Notes (Signed)
GASTROENTEROLOGY PROCEDURE H&P NOTE   Primary Care Physician: [name], MD    Reason for Procedure:   History of colonic ulcers, history of colon polyp  Plan:    Colonoscopy  Patient is appropriate for endoscopic procedure(s) in the ambulatory ([facility]) setting.  The nature of the procedure, as well as the risks, benefits, and alternatives were carefully and thoroughly reviewed with the patient. Ample time for discussion and questions allowed. The patient understood, was satisfied, and agreed to proceed.     HPI: Marie Singh is a 36 y.o. female who presents for colonoscopy for evaluation of history of colonic ulcers .  Patient was most recently seen in the Gastroenterology Clinic on 05/29/22.  No interval change in medical history since that appointment. Please refer to that note for full details regarding GI history and clinical presentation.   Past Medical History:  Diagnosis Date   Allergy    Anal lesion    Asthma    Dysphonia    followed by Dr West Carbo (ENT)--- vocal fold atrophy,  speech therapy   H/O varicella    History of cervical dysplasia    CIN 1,  CIN 2----   s/p LEEP procedure's   History of gastrointestinal ulcer    followed by Dr D. Sonny Masters (GI)   IBS (irritable bowel syndrome)    Migraine    Frequent   PCOS (polycystic ovarian syndrome) 2009   Seasonal allergic rhinitis    Wears contact lenses     Past Surgical History:  Procedure Laterality Date   BREAST REDUCTION SURGERY Bilateral 04/13/2011   '@[facility]'$  by dr Georgia Lopes   CERVICAL BIOPSY  W/ LOOP ELECTRODE EXCISION Bilateral 03/29/2020   COLONOSCOPY     COLONOSCOPY W/ BIOPSIES  03/04/2022   by dr d. Sonny Masters   EXCISION OF SKIN TAG N/A 06/26/2022   Procedure: EXCISION OF ANAL SKIN TAG, POSSIBLE FISTULOTOMY;  Surgeon: Leighton Ruff, MD;  Location: [facility];  Service: General;  Laterality: N/A;   MULTIPLE TOOTH EXTRACTIONS  2022   dental office w/ sedation   RECTAL EXAM UNDER  ANESTHESIA N/A 06/26/2022   Procedure: RECTAL EXAM UNDER ANESTHESIA;  Surgeon: Leighton Ruff, MD;  Location: [facility];  Service: General;  Laterality: N/A;    Prior to Admission medications   Medication Sig Start Date End Date Taking? Authorizing Provider  albuterol (VENTOLIN HFA) 108 (90 Base) MCG/ACT inhaler 2 PUFFS EVERY 4 HOURS AS NEEDED FOR COUGH OR WHEEZE. MAY USE 2 PUFFS 10-20 MINUTES PRIOR TO EXERCISE. 10/06/22  Yes Luking, Elayne Snare, MD  albuterol (VENTOLIN HFA) 108 (90 Base) MCG/ACT inhaler Inhale 2 puffs into the lungs as needed.   Yes [provider]  diphenhydrAMINE (BENADRYL) 25 MG tablet Take 25 mg by mouth every 6 (six) hours as needed.   Yes [provider]  fluticasone (FLOVENT HFA) 110 MCG/ACT inhaler Inhale 2 puffs into the lungs 2 (two) times daily.   Yes [provider]  Magnesium 200 MG TABS Take 1 tablet by mouth 2 (two) times daily. PRN   Yes [provider]  Prenatal Vit-Fe Fumarate-FA (PRENATAL VITAMIN) 27-0.8 MG TABS daily.   Yes [provider]  azelastine (ASTELIN) 0.1 % nasal spray Place 1 spray into both nostrils 2 (two) times daily. Use in each nostril as directed 10/09/22   Larose Kells, MD  beclomethasone (QVAR REDIHALER) 80 MCG/ACT inhaler Inhale 2 puffs into the lungs 2 (two) times daily. Patient taking differently:  Inhale 2 puffs into the lungs 2 (two) times daily. Not started yet 10/09/22   [name], MD  cetirizine (ZYRTEC ALLERGY) 10 MG tablet Take 1 tablet (10 mg total) by mouth daily. Patient taking differently: Take 10 mg by mouth daily. Uses PRN 08/25/22   Ameduite, Trenton Gammon, FNP  EPINEPHrine 0.3 mg/0.3 mL IJ SOAJ injection Inject 0.3 mg into the muscle once.    [provider]  famotidine (PEPCID) 20 MG tablet Take 1 tablet (20 mg total) by mouth 2 (two) times daily as needed (hives). 10/09/22   Larose Kells, MD  fexofenadine [facility] ALLERGY) 180 MG tablet Take 1 tablet (180  mg total) by mouth 2 (two) times daily as needed (hives, rhinitis). 10/09/22   Larose Kells, MD  fluticasone (FLONASE) 50 MCG/ACT nasal spray Place 2 sprays into both nostrils daily. Patient taking differently: Place 2 sprays into both nostrils daily. Takes PRN 10/09/22   Larose Kells, MD  methocarbamol (ROBAXIN) 500 MG tablet Take 500 mg by mouth every 8 (eight) hours as needed for muscle spasms.    [provider]  Olopatadine HCl 0.2 % SOLN Apply 1 drop to eye daily as needed (itchy, watery eyes). 10/09/22   Larose Kells, MD  predniSONE (DELTASONE) 20 MG tablet 2 po qd for 4d then 1qd for 4d Patient not taking: Reported on 10/09/2022 08/26/22   [name], MD    Current Outpatient Medications  Medication Sig Dispense Refill   albuterol (VENTOLIN HFA) 108 (90 Base) MCG/ACT inhaler 2 PUFFS EVERY 4 HOURS AS NEEDED FOR COUGH OR WHEEZE. MAY USE 2 PUFFS 10-20 MINUTES PRIOR TO EXERCISE. 18 each 1   albuterol (VENTOLIN HFA) 108 (90 Base) MCG/ACT inhaler Inhale 2 puffs into the lungs as needed.     diphenhydrAMINE (BENADRYL) 25 MG tablet Take 25 mg by mouth every 6 (six) hours as needed.     fluticasone (FLOVENT HFA) 110 MCG/ACT inhaler Inhale 2 puffs into the lungs 2 (two) times daily.     Magnesium 200 MG TABS Take 1 tablet by mouth 2 (two) times daily. PRN     Prenatal Vit-Fe Fumarate-FA (PRENATAL VITAMIN) 27-0.8 MG TABS daily.     azelastine (ASTELIN) 0.1 % nasal spray Place 1 spray into both nostrils 2 (two) times daily. Use in each nostril as directed 30 mL 5   beclomethasone (QVAR REDIHALER) 80 MCG/ACT inhaler Inhale 2 puffs into the lungs 2 (two) times daily. (Patient taking differently: Inhale 2 puffs into the lungs 2 (two) times daily. Not started yet) 1 each 1   cetirizine (ZYRTEC ALLERGY) 10 MG tablet Take 1 tablet (10 mg total) by mouth daily. (Patient taking differently: Take 10 mg by mouth daily. Uses PRN) 30 tablet 3   EPINEPHrine 0.3 mg/0.3 mL IJ SOAJ injection Inject  0.3 mg into the muscle once.     famotidine (PEPCID) 20 MG tablet Take 1 tablet (20 mg total) by mouth 2 (two) times daily as needed (hives). 60 tablet 5   fexofenadine (ALLEGRA ALLERGY) 180 MG tablet Take 1 tablet (180 mg total) by mouth 2 (two) times daily as needed (hives, rhinitis). 60 tablet 5   fluticasone (FLONASE) 50 MCG/ACT nasal spray Place 2 sprays into both nostrils daily. (Patient taking differently: Place 2 sprays into both nostrils daily. Takes PRN) 16 g 5   methocarbamol (ROBAXIN) 500 MG tablet Take 500 mg by mouth every 8 (eight) hours as needed for muscle spasms.  Olopatadine HCl 0.2 % SOLN Apply 1 drop to eye daily as needed (itchy, watery eyes). 2.5 mL 5   predniSONE (DELTASONE) 20 MG tablet 2 po qd for 4d then 1qd for 4d (Patient not taking: Reported on 10/09/2022) 12 tablet 0   Current Facility-Administered Medications  Medication Dose Route Frequency Provider Last Rate Last Admin   0.9 %  sodium chloride infusion  500 mL Intravenous Once Sharyn Creamer, MD        Allergies as of 10/20/2022 - Review Complete 10/20/2022  Allergen Reaction Noted   Amoxicillin Anaphylaxis and Hives 03/14/2008   Dulcolax [bisacodyl] Hives 05/29/2022   Metronidazole  04/23/2020   Miralax [polyethylene glycol] Hives 05/29/2022    Family History  Problem Relation Age of Onset   Diabetes Mother    Allergic rhinitis Mother    AAA (abdominal aortic aneurysm) Mother    Food Allergy Mother    Diverticulosis Mother    Heart attack Father    Hypertension Father    Allergic rhinitis Sister    Crohn's disease Brother    Angioedema Neg Hx    Asthma Neg Hx    Eczema Neg Hx    Immunodeficiency Neg Hx    Urticaria Neg Hx    Colon cancer Neg Hx    Esophageal cancer Neg Hx    Rectal cancer Neg Hx    Stomach cancer Neg Hx     Social History   Socioeconomic History   Marital status: Single    Spouse name: Not on file   Number of children: Not on file   Years of education: Not on file    Highest education level: Not on file  Occupational History   Occupation: [employer]  Tobacco Use   Smoking status: Never   Smokeless tobacco: Never  Vaping Use   Vaping Use: Never used  Substance and Sexual Activity   Alcohol use: Yes    Comment: rare   Drug use: Never   Sexual activity: Not on file  Other Topics Concern   Not on file  Social History Narrative   Not on file   Social Determinants of Health   Financial Resource Strain: Not on file  Food Insecurity: Not on file  Transportation Needs: Not on file  Physical Activity: Not on file  Stress: Not on file  Social Connections: Not on file  Intimate Partner Violence: Not on file    Physical Exam: Vital signs in last 24 hours: BP 121/67   Pulse 94   Temp 97.8 F (36.6 C) (Temporal)   Ht 5' 4.5" (1.638 m)   Wt 208 lb (94.3 kg)   SpO2 98%   BMI 35.15 kg/m  GEN: NAD EYE: Sclerae anicteric ENT: MMM CV: Non-tachycardic Pulm: No increased WOB GI: Soft NEURO:  Alert & Oriented   Christia Reading, MD [facility]   10/20/2022 8:16 AM

## 2022-10-20 NOTE — Patient Instructions (Addendum)
Handouts provided on polyps and hemorrhoids.   Your previously visualized colonic ulcers have healed.  Await pathology results.  Continue present medications.   YOU HAD AN ENDOSCOPIC PROCEDURE TODAY AT [facility]:   Refer to the procedure report that was given to you for any specific questions about what was found during the examination.  If the procedure report does not answer your questions, please call your gastroenterologist to clarify.  If you requested that your care partner not be given the details of your procedure findings, then the procedure report has been included in a sealed envelope for you to review at your convenience later.  YOU SHOULD EXPECT: Some feelings of bloating in the abdomen. Passage of more gas than usual.  Walking can help get rid of the air that was put into your GI tract during the procedure and reduce the bloating. If you had a lower endoscopy (such as a colonoscopy or flexible sigmoidoscopy) you may notice spotting of blood in your stool or on the toilet paper. If you underwent a bowel prep for your procedure, you may not have a normal bowel movement for a few days.  Please Note:  You might notice some irritation and congestion in your nose or some drainage.  This is from the oxygen used during your procedure.  There is no need for concern and it should clear up in a day or so.  SYMPTOMS TO REPORT IMMEDIATELY:  Following lower endoscopy (colonoscopy or flexible sigmoidoscopy):  Excessive amounts of blood in the stool  Significant tenderness or worsening of abdominal pains  Swelling of the abdomen that is new, acute  Fever of 100F or higher  For urgent or emergent issues, a gastroenterologist can be reached at any hour by calling [phone]. Do not use MyChart messaging for urgent concerns.    DIET:  We do recommend a small meal at first, but then you may proceed to your regular diet.  Drink plenty of fluids but you should avoid alcoholic  beverages for 24 hours.  ACTIVITY:  You should plan to take it easy for the rest of today and you should NOT DRIVE or use heavy machinery until tomorrow (because of the sedation medicines used during the test).    FOLLOW UP: Our staff will call the number listed on your records the next business day following your procedure.  We will call around 7:15- 8:00 am to check on you and address any questions or concerns that you may have regarding the information given to you following your procedure. If we do not reach you, we will leave a message.     If any biopsies were taken you will be contacted by phone or by letter within the next 1-3 weeks.  Please call us at [phone] if you have not heard about the biopsies in 3 weeks.    SIGNATURES/CONFIDENTIALITY: You and/or your care partner have signed paperwork which will be entered into your electronic medical record.  These signatures attest to the fact that that the information above on your After Visit Summary has been reviewed and is understood.  Full responsibility of the confidentiality of this discharge information lies with you and/or your care-partner.

## 2022-10-20 NOTE — Progress Notes (Signed)
Called to room to assist during endoscopic procedure.  Patient ID and intended procedure confirmed with present staff. Received instructions for my participation in the procedure from the performing physician.  

## 2022-10-21 ENCOUNTER — Telehealth: Payer: Self-pay

## 2022-10-21 NOTE — Telephone Encounter (Signed)
  Follow up Call-     10/20/2022    7:51 AM 03/04/2022    2:13 PM  Call back number  Post procedure Call Back phone  # [phone]  Permission to leave phone message Yes Yes     Patient questions:  Do you have a fever, pain , or abdominal swelling? No. Pain Score  0 *  Have you tolerated food without any problems? Yes.    Have you been able to return to your normal activities? Yes.    Do you have any questions about your discharge instructions: Diet   No. Medications  No. Follow up visit  No.  Do you have questions or concerns about your Care? No.  Actions: * If pain score is 4 or above: No action needed, pain <4.

## 2022-10-26 ENCOUNTER — Encounter: Payer: Self-pay | Admitting: Internal Medicine

## 2022-11-20 ENCOUNTER — Ambulatory Visit (INDEPENDENT_AMBULATORY_CARE_PROVIDER_SITE_OTHER): Payer: BC Managed Care – PPO | Admitting: Internal Medicine

## 2022-11-20 ENCOUNTER — Other Ambulatory Visit: Payer: Self-pay | Admitting: Internal Medicine

## 2022-11-20 ENCOUNTER — Other Ambulatory Visit: Payer: Self-pay

## 2022-11-20 ENCOUNTER — Encounter: Payer: Self-pay | Admitting: Internal Medicine

## 2022-11-20 VITALS — BP 118/50 | HR 78 | Temp 97.3°F | Resp 12 | Wt 200.9 lb

## 2022-11-20 DIAGNOSIS — J4520 Mild intermittent asthma, uncomplicated: Secondary | ICD-10-CM

## 2022-11-20 DIAGNOSIS — L501 Idiopathic urticaria: Secondary | ICD-10-CM

## 2022-11-20 DIAGNOSIS — J3089 Other allergic rhinitis: Secondary | ICD-10-CM | POA: Diagnosis not present

## 2022-11-20 DIAGNOSIS — R058 Other specified cough: Secondary | ICD-10-CM | POA: Diagnosis not present

## 2022-11-20 DIAGNOSIS — L500 Allergic urticaria: Secondary | ICD-10-CM

## 2022-11-20 DIAGNOSIS — H1013 Acute atopic conjunctivitis, bilateral: Secondary | ICD-10-CM | POA: Diagnosis not present

## 2022-11-20 MED ORDER — AZELASTINE HCL 0.1 % NA SOLN: 1.0000 | Freq: Two times a day (BID) | NASAL | 5 refills | Status: AC

## 2022-11-20 MED ORDER — FLUTICASONE PROPIONATE 50 MCG/ACT NA SUSP: 2.0000 | Freq: Every day | NASAL | 5 refills | Status: AC

## 2022-11-20 MED ORDER — OLOPATADINE HCL 0.2 % OP SOLN: 1.0000 [drp] | Freq: Every day | OPHTHALMIC | 5 refills | Status: AC | PRN

## 2022-11-20 MED ORDER — FEXOFENADINE HCL 180 MG PO TABS: 180.0000 mg | ORAL_TABLET | Freq: Two times a day (BID) | ORAL | 5 refills | Status: DC | PRN

## 2022-11-20 MED ORDER — QVAR REDIHALER 80 MCG/ACT IN AERB: INHALATION_SPRAY | RESPIRATORY_TRACT | 1 refills | Status: DC

## 2022-11-20 MED ORDER — ALBUTEROL SULFATE HFA 108 (90 BASE) MCG/ACT IN AERS: 2.0000 | INHALATION_SPRAY | Freq: Four times a day (QID) | RESPIRATORY_TRACT | 1 refills | Status: AC | PRN

## 2022-11-20 MED ORDER — BENZONATATE 100 MG PO CAPS: 100.0000 mg | ORAL_CAPSULE | Freq: Three times a day (TID) | ORAL | 0 refills | Status: AC | PRN

## 2022-11-20 NOTE — Progress Notes (Signed)
FOLLOW UP Date of Service/Encounter:  11/20/22   Subjective:  Marie Singh (DOB: 21-Jul-1986) is a 37 y.o. female who [facility] to [facility]2022 for follow up for idiopathic urticaria, allergic rhinoconjunctivitis, mild intermittent asthma and food allergy.   History obtained from: chart review and patient. Last visit was with me 10/09/2022: Mild Intermittent Asthma: controlled, Qvar PRN with respiratory illness, Albuterol PRN CIU: allegra/pepcid step up regimen Allergic rhinoconjunctivitis: Flonase, Azelastine/Zyrtec/Olopatadine PRN Food Allergy: negative SPT and sIgE to treenuts; recommend reintroduction at home as she never had anaphylaxis.   Asthma: Since last visit, she reports her asthma is doing well but she is having trouble with cough.  Started around end of November, first week of December after a respiratory illness.  Mixed dry and wet cough.  It is slowly improving.  She tried albuterol and leftover Flovent from prior but this did not help. She denies any SOB/wheezing.  She has used tessalon perles in the past and it seems to help. No ER visits/oral prednisone.   Hives: No recurrence of hives since last visit.  Not taking Allegra/Pepcid as her hives are doing fine.    Rhinitis/Conjunctivitis: In terms of her allergies, she reports they are much better.  She has some sneezing but not much congestion/rhinorrhea/itchy watery eyes.  Using Flonase and Allegra PRN.  Has not required Azelastine or eye drops.   Food Allergy: She is excited to reintroduce treenuts but forgot to buy a bag of them so still has not reintroduced them.    Past Medical History: Past Medical History:  Diagnosis Date   Allergy    Anal lesion    Asthma    Dysphonia    followed by Dr West Carbo (ENT)--- vocal fold atrophy,  speech therapy   H/O varicella    History of cervical dysplasia    CIN 1,  CIN 2----   s/p LEEP procedure's   History of gastrointestinal ulcer     followed by Dr D. Sonny Masters (GI)   IBS (irritable bowel syndrome)    Migraine    Frequent   PCOS (polycystic ovarian syndrome) 2009   Seasonal allergic rhinitis    Wears contact lenses     Objective:  BP (!) 118/50   Pulse 78   Temp (!) 97.3 F (36.3 C) (Temporal)   Resp 12   Wt 200 lb 14.4 oz (91.1 kg)   SpO2 99%   BMI 33.95 kg/m  Body mass index is 33.95 kg/m. Physical Exam: GEN: alert, well developed HEENT: clear conjunctiva, TM grey and translucent, nose with moderate inferior turbinate hypertrophy, pink nasal mucosa, clear rhinorrhea, no cobblestoning HEART: regular rate and rhythm, no murmur LUNGS: clear to auscultation bilaterally, no coughing, unlabored respiration SKIN: no rashes or lesions  Assessment/Plan  Mild Intermittent Asthma Post Viral Cough Syndrome - Believe the cough you have currently is post viral cough syndrome which can linger for weeks after an illness. Try tessalon perles '100mg'$  up to three times a day as needed for 1-2 weeks.  - MDI technique discussed.   - Maintenance inhaler: none - If you have a flare up or any respiratory illness, start Qvar 20mg 1 puffs twice daily for 1-2 weeks. This is similar to F[employer] Corporationbut covered by insurance.  - Rescue inhaler: Albuterol 2 puffs via spacer or 1 vial via nebulizer every 4-6 hours as needed for respiratory symptoms of cough, shortness of breath, or wheezing Asthma control goals:  Full participation in all desired  activities (may need albuterol before activity) Albuterol use two times or less a week on average (not counting use with activity) Cough interfering with sleep two times or less a month Oral steroids no more than once a year No hospitalizations  Allergic Rhinitis Allergic Conjunctivitis  - Improved with medical management, continue therapy as below.  - Positive skin test 09/2022 to grasses, weeds, trees, DM, mold - Avoidance measures discussed. - Use nasal saline rinses before nose sprays such as  with Neilmed Sinus Rinse.  Use distilled water.   - Use Flonase 2 sprays each nostril daily. Aim upward and outward. - Use Azelastine 1-2 sprays each nostril twice daily as needed. Aim upward and outward. - Use Allegra '180mg'$  daily as needed for runny nose or itchy watery eyes.  - For eyes, use Olopatadine or Ketotifen 1 eye drop daily as needed for itchy, watery eyes.  Available over the counter, if not covered by insurance.  - Consider allergy shots as long term control of your symptoms by teaching your immune system to be more tolerant of your allergy triggers  Idiopathic Urticaria (Hives): - Stable, no recurrence. Discussed anti histamine step up if it recurs.  - At this time etiology of hives and swelling is unknown. Hives can be caused by a variety of different triggers including illness/infection, exercise, pressure, vibrations, extremes of temperature to name a few however majority of the time there is no identifiable trigger.  -Start Allegra '180mg'$  daily.   -If no improvement in 2-3 days, increase to Allegra '180mg'$  twice daily.   -If no improvement in 2-3 days, add Pepcid '20mg'$  twice daily and continue Allegra '180mg'$  twice daily. -If still no improvement, increase to Allegra '360mg'$  twice daily and Pepcid '40mg'$  twice daily.   Food allergy:  - Concern for treenut allergy due to hives but timeline did not fit.  SPT was negative to treenuts 10/2022.  sIgE was negative to treenuts also 10/2022.  Recommend reintroduction of treenuts at home.   Return in about 3 months (around 02/19/2023). Harlon Flor, MD  [facility]

## 2022-11-20 NOTE — Patient Instructions (Addendum)
Mild Intermittent Asthma Post Viral Cough Syndrome - Believe the cough you have currently is post viral cough syndrome which can linger for weeks after an illness. Try tessalon perles '100mg'$  up to three times a day as needed for 1-2 weeks.  - MDI technique discussed.   - Maintenance inhaler: none - If you have a flare up or any respiratory illness, start Qvar 53mg 1 puffs twice daily for 1-2 weeks. - Rescue inhaler: Albuterol 2 puffs via spacer or 1 vial via nebulizer every 4-6 hours as needed for respiratory symptoms of cough, shortness of breath, or wheezing Asthma control goals:  Full participation in all desired activities (may need albuterol before activity) Albuterol use two times or less a week on average (not counting use with activity) Cough interfering with sleep two times or less a month Oral steroids no more than once a year No hospitalizations  Allergic Rhinitis Allergic Conjunctivitis  - Positive skin test 09/2022 to grasses, weeds, trees, DM, mold - Avoidance measures discussed. - Use nasal saline rinses before nose sprays such as with Neilmed Sinus Rinse.  Use distilled water.   - Use Flonase 2 sprays each nostril daily. Aim upward and outward. - Use Azelastine 1-2 sprays each nostril twice daily as needed. Aim upward and outward. - Use Allegra '180mg'$  daily as needed for runny nose or itchy watery eyes.  - For eyes, use Olopatadine or Ketotifen 1 eye drop daily as needed for itchy, watery eyes.  Available over the counter, if not covered by insurance.  - Consider allergy shots as long term control of your symptoms by teaching your immune system to be more tolerant of your allergy triggers  Idiopathic Urticaria (Hives): - At this time etiology of hives and swelling is unknown. Hives can be caused by a variety of different triggers including illness/infection, exercise, pressure, vibrations, extremes of temperature to name a few however majority of the time there is no  identifiable trigger.  -Start Allegra '180mg'$  daily.   -If no improvement in 2-3 days, increase to Allegra '180mg'$  twice daily.   -If no improvement in 2-3 days, add Pepcid '20mg'$  twice daily and continue Allegra '180mg'$  twice daily. -If still no improvement, increase to Allegra '360mg'$  twice daily and Pepcid '40mg'$  twice daily.   Food allergy:  - Concern for treenut allergy due to hives but timeline did not fit.  SPT was negative to treenuts 10/2022.  sIgE was negative to treenuts also 10/2022.  Recommend reintroduction of treenuts at home.   Return in about 3 months (around 02/19/2023).

## 2023-02-19 IMAGING — CT CT ABD-PELV W/ CM
2 of 4 series · 17 of 46 positions shown, 19 images · IV contrast (agent unspecified)
Comparison: None.

CLINICAL DATA: Diarrhea, left lower quadrant pain

EXAM:
CT ABDOMEN AND PELVIS WITH CONTRAST
TECHNIQUE: Multidetector CT imaging of the abdomen and pelvis was performed
using the standard protocol following bolus administration of
intravenous contrast.

[Series 2: axial st · axial · 0.81mm/px · z∈[+1067,+1462]mm · 14 of 91 slices shown, 16 images]
[im 6/91  soft-tissue]
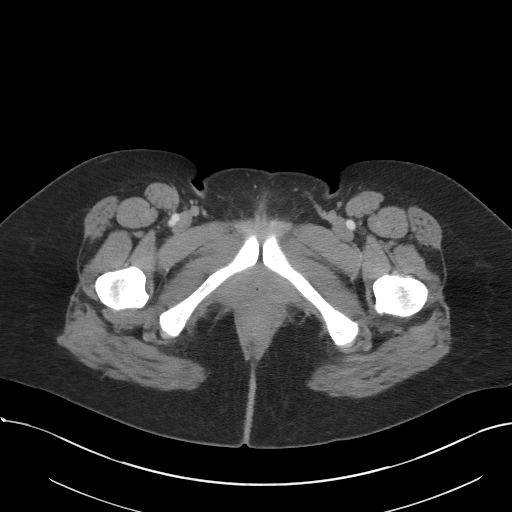
[im 6/91  bone]
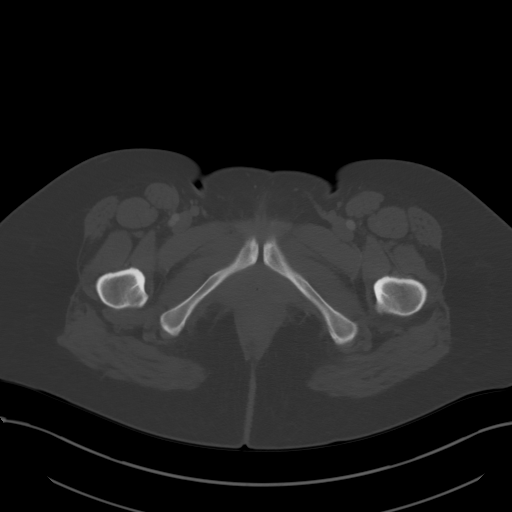
[im 12/91  soft-tissue]
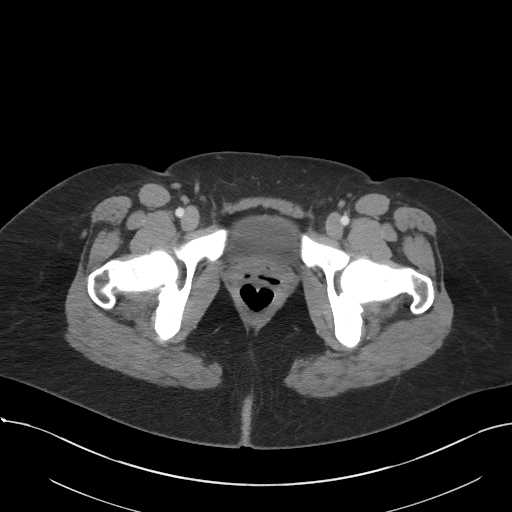
[im 17/91  soft-tissue]
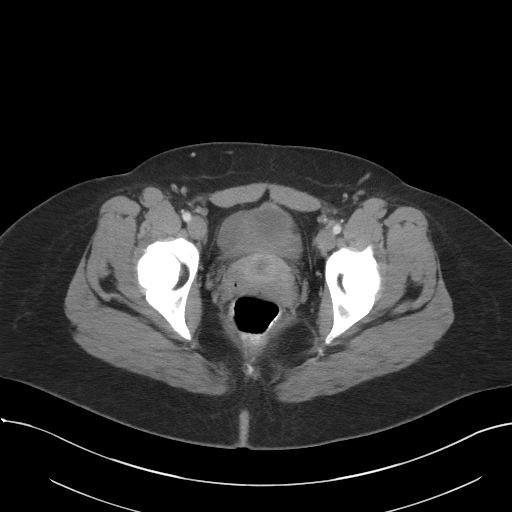
[im 23/91  soft-tissue]
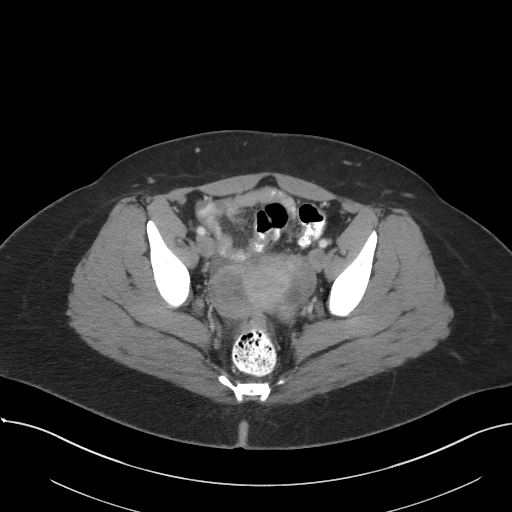
[im 29/91  soft-tissue]
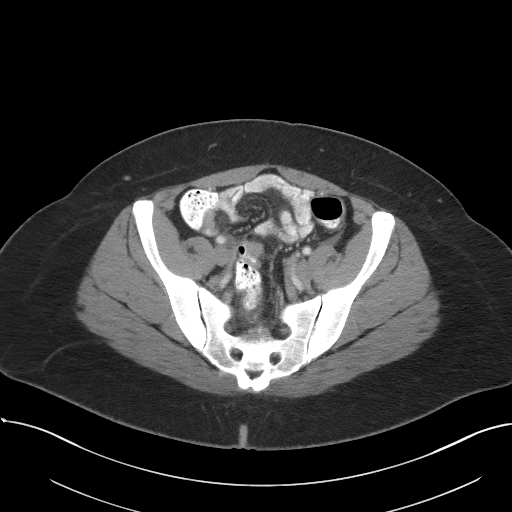
[im 34/91  soft-tissue]
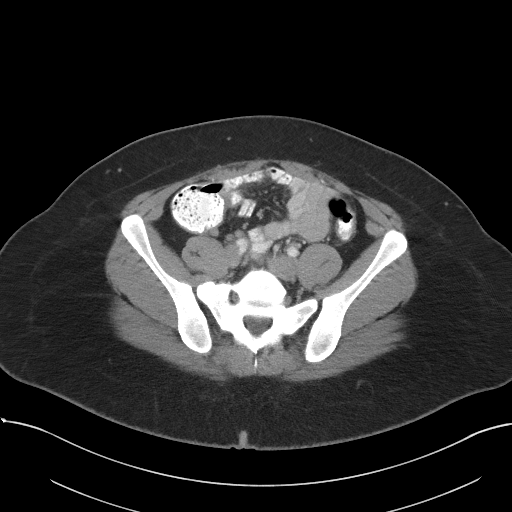
[im 40/91  soft-tissue]
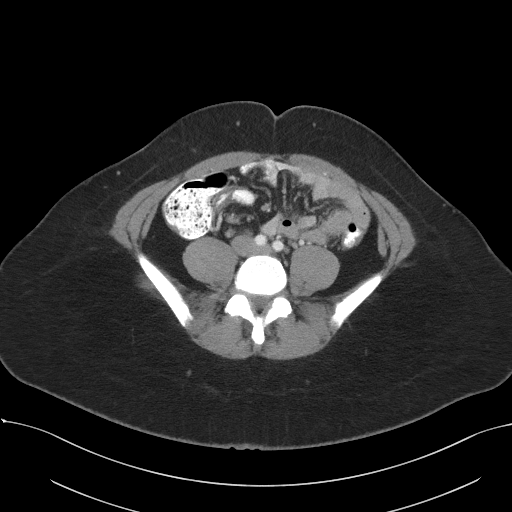
[im 51/91  soft-tissue]
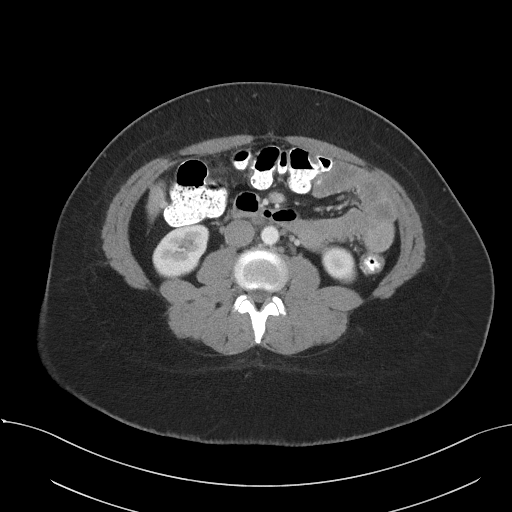
[im 57/91  soft-tissue]
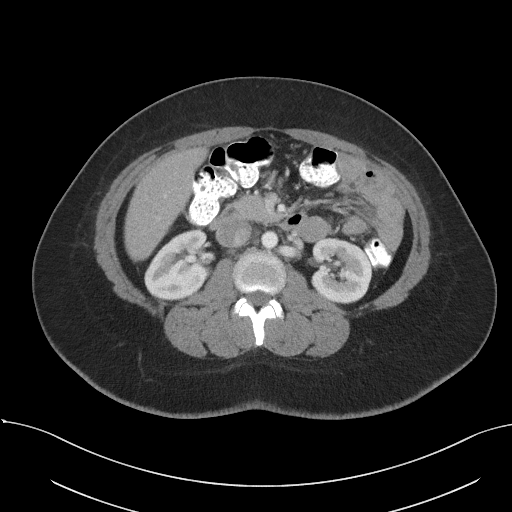
[im 57/91  bone]
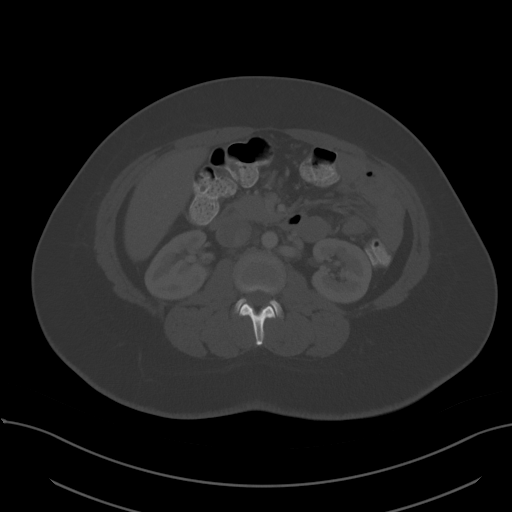
[im 62/91  soft-tissue]
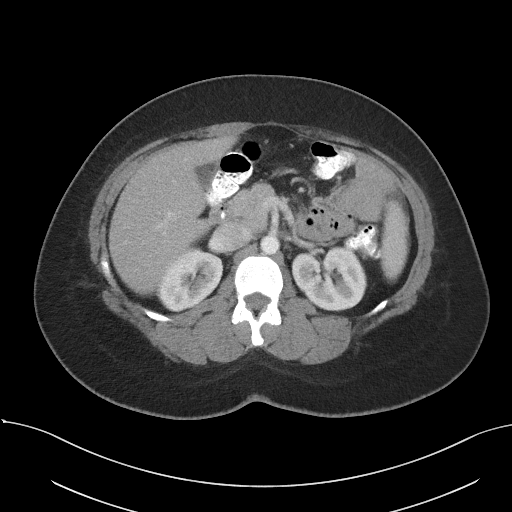
[im 68/91  soft-tissue]
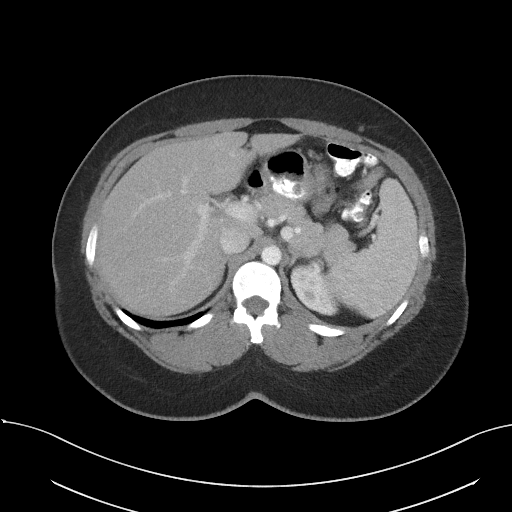
[im 74/91  soft-tissue]
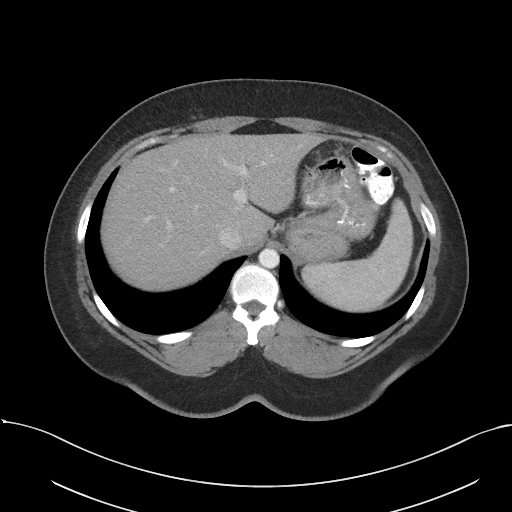
[im 79/91  soft-tissue]
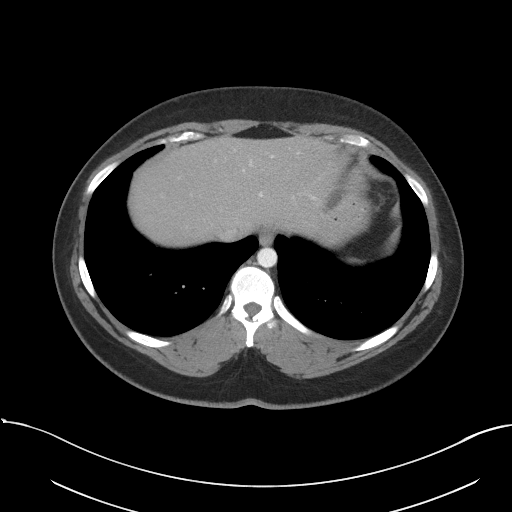
[im 85/91  soft-tissue]
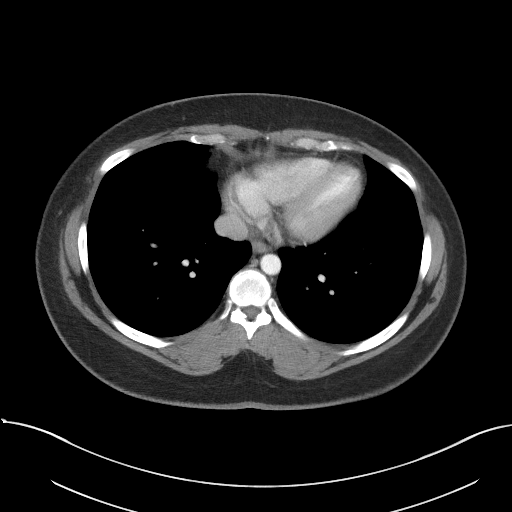

[Series 5: coronal st · coronal · 0.89mm/px · 3 of 101 slices shown]
[im 34/101  soft-tissue]
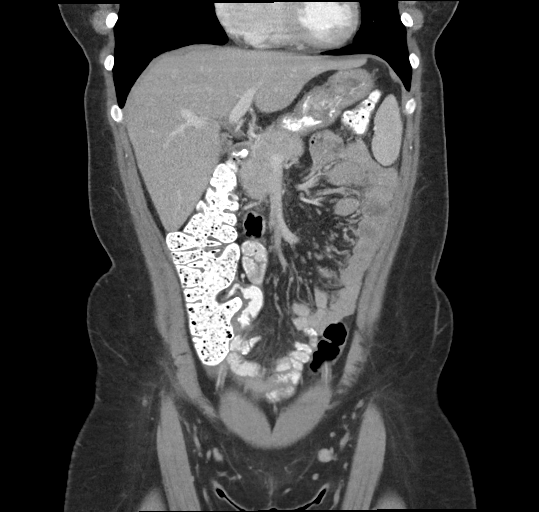
[im 45/101  soft-tissue]
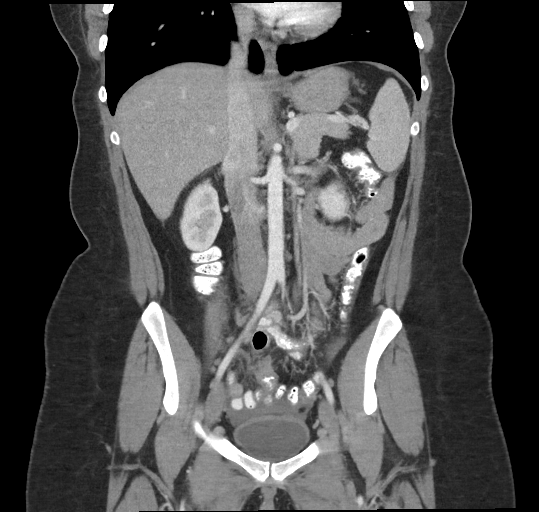
[im 56/101  soft-tissue]
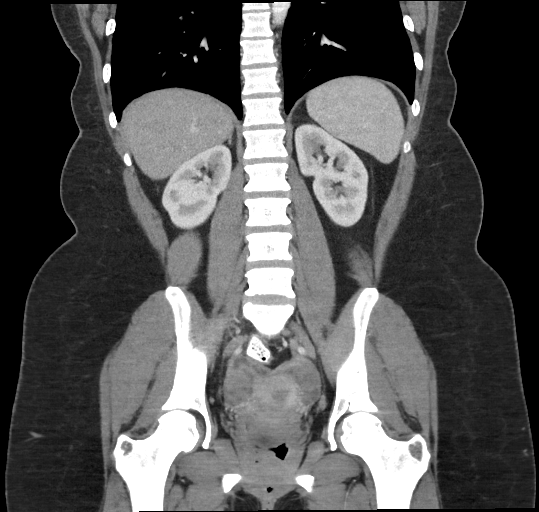

[17 of 46 positions shown; findings below may reference images not displayed]

RADIATION DOSE REDUCTION: This exam was performed according to the
departmental dose-optimization program which includes automated
exposure control, adjustment of the mA and/or kV according to
patient size and/or use of iterative reconstruction technique.

CONTRAST:  100mL OMNIPAQUE IOHEXOL 300 MG/ML  SOLN
FINDINGS: Lower chest: No acute abnormality

Hepatobiliary: No focal hepatic abnormality. Gallbladder
unremarkable.

Pancreas: No focal abnormality or ductal dilatation.

Spleen: No focal abnormality.  Normal size.

Adrenals/Urinary Tract: No adrenal abnormality. No focal renal
abnormality. No stones or hydronephrosis. Urinary bladder is
unremarkable.

Stomach/Bowel: Normal appendix. Stomach, large and small bowel
grossly unremarkable.

Vascular/Lymphatic: No evidence of aneurysm or adenopathy.

Reproductive: Bilateral ovarian follicles. Uterus and adnexa
unremarkable. No mass.

Other: Small amount of free fluid in the pelvis.  No free air.

Musculoskeletal: No acute bony abnormality.
IMPRESSION: No acute findings in the abdomen or pelvis.

Small amount of free fluid in the pelvis, likely physiologic.

## 2024-02-14 ENCOUNTER — Other Ambulatory Visit: Payer: Self-pay | Admitting: Internal Medicine

## 2024-02-28 ENCOUNTER — Other Ambulatory Visit: Payer: Self-pay | Admitting: Internal Medicine

## 2024-02-29 NOTE — Patient Instructions (Incomplete)
 Mild Intermittent Asthma -controlled - Maintenance inhaler: none - If you have a flare up or any respiratory illness, start Qvar  80mcg 1 puffs twice daily for 1-2 weeks. - Rescue inhaler: Albuterol  2 puffs via spacer or 1 vial via nebulizer every 4-6 hours as needed for respiratory symptoms of cough, shortness of breath, or wheezing Asthma control goals:  Full participation in all desired activities (may need albuterol  before activity) Albuterol  use two times or less a week on average (not counting use with activity) Cough interfering with sleep two times or less a month Oral steroids no more than once a year No hospitalizations  Allergic Rhinitis- not well controlled Allergic Conjunctivitis -controlled with Allegra  - Positive skin test 09/2022 to grasses, weeds, trees, DM, mold - Avoidance measures discussed. - Use nasal saline rinses before nose sprays such as with Neilmed Sinus Rinse.  Use distilled water.   - Use Flonase  2 sprays each nostril daily. Aim upward and outward. - Use Azelastine  1-2 sprays each nostril twice daily as needed. Aim upward and outward. - Use Allegra  180mg  daily as needed for runny nose or itchy watery eyes.  - For eyes, use Olopatadine  or Ketotifen 1 eye drop daily as needed for itchy, watery eyes.  Available over the counter, if not covered by insurance.  - Consider allergy  shots as long term control of your symptoms by teaching your immune system to be more tolerant of your allergy  triggers  Idiopathic Urticaria (Hives):none since last appointment - At this time etiology of hives and swelling is unknown. Hives can be caused by a variety of different triggers including illness/infection, exercise, pressure, vibrations, extremes of temperature to name a few however majority of the time there is no identifiable trigger.  -Start Allegra  180mg  daily.   -If no improvement in 2-3 days, increase to Allegra  180mg  twice daily.   -If no improvement in 2-3 days, add Pepcid   20mg  twice daily and continue Allegra  180mg  twice daily. -If still no improvement, increase to Allegra  360mg  twice daily and Pepcid  40mg  twice daily.   Food allergy :  - Concern for treenut allergy  due to hives but timeline did not fit.  SPT was negative to treenuts 10/2022.  sIgE was negative to treenuts also 10/2022.  Recommend reintroduction of treenuts at home.   Follow up in 6 months or sooner if needed

## 2024-03-01 ENCOUNTER — Encounter: Payer: Self-pay | Admitting: Family

## 2024-03-01 ENCOUNTER — Ambulatory Visit: Admitting: Family

## 2024-03-01 ENCOUNTER — Other Ambulatory Visit: Payer: Self-pay

## 2024-03-01 VITALS — BP 112/58 | HR 64 | Temp 97.4°F | Resp 16 | Ht 64.96 in | Wt 216.6 lb

## 2024-03-01 DIAGNOSIS — H1013 Acute atopic conjunctivitis, bilateral: Secondary | ICD-10-CM | POA: Diagnosis not present

## 2024-03-01 DIAGNOSIS — J302 Other seasonal allergic rhinitis: Secondary | ICD-10-CM | POA: Diagnosis not present

## 2024-03-01 DIAGNOSIS — J4520 Mild intermittent asthma, uncomplicated: Secondary | ICD-10-CM

## 2024-03-01 DIAGNOSIS — J3089 Other allergic rhinitis: Secondary | ICD-10-CM

## 2024-03-01 MED ORDER — FEXOFENADINE HCL 180 MG PO TABS: ORAL_TABLET | ORAL | 5 refills | Status: AC

## 2024-03-01 NOTE — Progress Notes (Signed)
 [address] [place] [address] Dept: [phone]  FOLLOW UP NOTE  Patient ID: Marie Singh, female    DOB: 1985/11/14  Age: 38 y.o. MRN: [id number] Date of Office Visit: 03/01/2024  Assessment  Chief Complaint: Allergies  HPI Marie Singh is a 38 year old female who presents today for follow-up of mild intermittent asthma, postviral cough syndrome, allergic rhinitis, allergic conjunctivitis, idiopathic urticaria, and food allergy .  She was last seen on November 20, 2022 by Dr. Lydia Sams.  She reports that she is currently taking vitamin D 50,000 units for 8 weeks due to low vitamin D.  If she has not had any surgeries since her last office visit.  Mild intermittent asthma: She reports only a little bit of coughing that is not extreme due to postnasal drip.  She denies wheezing, tightness in chest, shortness of breath, nocturnal awakenings due to breathing problems, fever, and chills.  She has not had to use her albuterol  inhaler since her last appointment and has not used Qvar  80 mcg for asthma flare/upper respiratory infections.  She reports that she does not need any refills of both of these medications.  She reports that she has not had to use her albuterol  since having the postviral cough syndrome.  Allergic rhinitis/allergic conjunctivitis: She reports rhinorrhea that is now clear.  Previously was a light green with a few little spots of red 2 times.  She also has nasal congestion and postnasal drip.  She does not really like to use nasal sprays and has not used Flonase  or azelastine  nasal spray.  She does take Allegra  180 mg once a day.  She is not interested in starting allergy  injections.  She does not like needles.  She does report itchy watery eyes for which  fexofenadine  usually helps or she will use contact solution.  She does wear contacts.  Idiopathic urticaria: She reports that she has not had any hives since her last office visit.  She reports that she has still not  eaten cashews, but she has eaten pecans without any problems.  Otherwise she is not avoiding any foods.   Drug Allergies:  Allergies  Allergen Reactions   Amoxicillin Anaphylaxis and Hives    Can take cephalosporins   Dulcolax [Bisacodyl] Hives   Metronidazole     urticaria   Miralax [Polyethylene Glycol] Hives    Review of Systems: Negative except as per HPI   Physical Exam: BP (!) 112/58 (BP Location: Left Arm, Patient Position: Sitting, Cuff Size: Normal)   Pulse 64   Temp (!) 97.4 F (36.3 C) (Temporal)   Resp 16   Ht 5' 4.96" (1.65 m)   Wt 216 lb 9.6 oz (98.2 kg)   SpO2 100%   BMI 36.09 kg/m    Physical Exam Constitutional:      Appearance: Normal appearance.  HENT:     Head: Normocephalic and atraumatic.     Comments: Pharynx normal, eyes normal, ears normal, nose: Bilateral lower turbinates mildly edematous and slightly erythematous with no drainage noted    Right Ear: Tympanic membrane, ear canal and external ear normal.     Left Ear: Tympanic membrane, ear canal and external ear normal.     Mouth/Throat:     Mouth: Mucous membranes are moist.     Pharynx: Oropharynx is clear.  Eyes:     Conjunctiva/sclera: Conjunctivae normal.  Cardiovascular:     Rate and Rhythm: Regular rhythm.     Heart sounds: Normal heart sounds.  Pulmonary:     Effort: Pulmonary effort is normal.     Breath sounds: Normal breath sounds.     Comments: Lungs clear to auscultation Musculoskeletal:     Cervical back: Neck supple.  Skin:    General: Skin is warm.     Comments: No rashes or urticarial lesions noted  Neurological:     Mental Status: She is alert and oriented to person, place, and time.  Psychiatric:        Mood and Affect: Mood normal.        Behavior: Behavior normal.        Thought Content: Thought content normal.        Judgment: Judgment normal.     Diagnostics: FVC 3.12 L (98%), FEV1 2.57 L (97%), FEV1/FVC 0.82.  Spirometry indicates normal  spirometry.  Assessment and Plan: 1. Seasonal and perennial allergic rhinitis   2. Mild intermittent asthma without complication   3. Allergic conjunctivitis of both eyes     No orders of the defined types were placed in this encounter.   Patient Instructions  Mild Intermittent Asthma -controlled - Maintenance inhaler: none - If you have a flare up or any respiratory illness, start Qvar  80mcg 1 puffs twice daily for 1-2 weeks. - Rescue inhaler: Albuterol  2 puffs via spacer or 1 vial via nebulizer every 4-6 hours as needed for respiratory symptoms of cough, shortness of breath, or wheezing Asthma control goals:  Full participation in all desired activities (may need albuterol  before activity) Albuterol  use two times or less a week on average (not counting use with activity) Cough interfering with sleep two times or less a month Oral steroids no more than once a year No hospitalizations  Allergic Rhinitis- not well controlled Allergic Conjunctivitis -controlled with Allegra  - Positive skin test 09/2022 to grasses, weeds, trees, DM, mold - Avoidance measures discussed. - Use nasal saline rinses before nose sprays such as with Neilmed Sinus Rinse.  Use distilled water.   - Use Flonase  2 sprays each nostril daily. Aim upward and outward. - Use Azelastine  1-2 sprays each nostril twice daily as needed. Aim upward and outward. - Use Allegra  180mg  daily as needed for runny nose or itchy watery eyes.  - For eyes, use Olopatadine  or Ketotifen 1 eye drop daily as needed for itchy, watery eyes.  Available over the counter, if not covered by insurance.  - Consider allergy  shots as long term control of your symptoms by teaching your immune system to be more tolerant of your allergy  triggers  Idiopathic Urticaria (Hives):none since last appointment - At this time etiology of hives and swelling is unknown. Hives can be caused by a variety of different triggers including illness/infection, exercise,  pressure, vibrations, extremes of temperature to name a few however majority of the time there is no identifiable trigger.  -Start Allegra  180mg  daily.   -If no improvement in 2-3 days, increase to Allegra  180mg  twice daily.   -If no improvement in 2-3 days, add Pepcid  20mg  twice daily and continue Allegra  180mg  twice daily. -If still no improvement, increase to Allegra  360mg  twice daily and Pepcid  40mg  twice daily.   Food allergy :  - Concern for treenut allergy  due to hives but timeline did not fit.  SPT was negative to treenuts 10/2022.  sIgE was negative to treenuts also 10/2022.  Recommend reintroduction of treenuts at home.   Follow up in 6 months or sooner if needed      Return in about 6 months (  around 08/31/2024).    Thank you for the opportunity to care for this patient.  Please do not hesitate to contact me with questions.  Tinnie Forehand, FNP [facility]
